# Patient Record
Sex: Female | Born: 1985 | Race: White | Hispanic: Yes | Marital: Single
Health system: Southern US, Community
[De-identification: ages and names within clinical notes are randomized; demographics above are authoritative.]

## PROBLEM LIST (undated history)

## (undated) DIAGNOSIS — J9611 Chronic respiratory failure with hypoxia: Secondary | ICD-10-CM

## (undated) DIAGNOSIS — S069X9A Unspecified intracranial injury with loss of consciousness of unspecified duration, initial encounter: Secondary | ICD-10-CM

## (undated) DIAGNOSIS — G931 Anoxic brain damage, not elsewhere classified: Secondary | ICD-10-CM

## (undated) DIAGNOSIS — L8990 Pressure ulcer of unspecified site, unspecified stage: Secondary | ICD-10-CM

## (undated) DIAGNOSIS — S069XAA Unspecified intracranial injury with loss of consciousness status unknown, initial encounter: Secondary | ICD-10-CM

## (undated) DIAGNOSIS — I82409 Acute embolism and thrombosis of unspecified deep veins of unspecified lower extremity: Secondary | ICD-10-CM

## (undated) DIAGNOSIS — Z930 Tracheostomy status: Secondary | ICD-10-CM

## (undated) DIAGNOSIS — R403 Persistent vegetative state: Secondary | ICD-10-CM

---

## 2018-12-14 ENCOUNTER — Inpatient Hospital Stay (HOSPITAL_COMMUNITY): Payer: Medicare Other

## 2018-12-14 ENCOUNTER — Encounter (HOSPITAL_COMMUNITY): Payer: Self-pay | Admitting: Pulmonary Disease

## 2018-12-14 ENCOUNTER — Inpatient Hospital Stay (HOSPITAL_COMMUNITY)
Admission: AD | Admit: 2018-12-14 | Discharge: 2018-12-20 | DRG: 870 | Disposition: A | Discharge status: Skilled Nursing Facility | Payer: Medicare Other | Source: Other Acute Inpatient Hospital | Attending: Family Medicine | Admitting: Family Medicine

## 2018-12-14 DIAGNOSIS — R403 Persistent vegetative state: Secondary | ICD-10-CM | POA: Diagnosis present

## 2018-12-14 DIAGNOSIS — I469 Cardiac arrest, cause unspecified: Secondary | ICD-10-CM

## 2018-12-14 DIAGNOSIS — K5090 Crohn's disease, unspecified, without complications: Secondary | ICD-10-CM | POA: Diagnosis present

## 2018-12-14 DIAGNOSIS — R1310 Dysphagia, unspecified: Secondary | ICD-10-CM

## 2018-12-14 DIAGNOSIS — Y838 Other surgical procedures as the cause of abnormal reaction of the patient, or of later complication, without mention of misadventure at the time of the procedure: Secondary | ICD-10-CM | POA: Diagnosis present

## 2018-12-14 DIAGNOSIS — B9620 Unspecified Escherichia coli [E. coli] as the cause of diseases classified elsewhere: Secondary | ICD-10-CM | POA: Diagnosis present

## 2018-12-14 DIAGNOSIS — D62 Acute posthemorrhagic anemia: Secondary | ICD-10-CM | POA: Diagnosis present

## 2018-12-14 DIAGNOSIS — I471 Supraventricular tachycardia: Secondary | ICD-10-CM | POA: Diagnosis present

## 2018-12-14 DIAGNOSIS — Z7401 Bed confinement status: Secondary | ICD-10-CM

## 2018-12-14 DIAGNOSIS — A419 Sepsis, unspecified organism: Principal | ICD-10-CM | POA: Diagnosis present

## 2018-12-14 DIAGNOSIS — D509 Iron deficiency anemia, unspecified: Secondary | ICD-10-CM | POA: Diagnosis present

## 2018-12-14 DIAGNOSIS — E876 Hypokalemia: Secondary | ICD-10-CM | POA: Diagnosis present

## 2018-12-14 DIAGNOSIS — N390 Urinary tract infection, site not specified: Secondary | ICD-10-CM | POA: Diagnosis present

## 2018-12-14 DIAGNOSIS — L89626 Pressure-induced deep tissue damage of left heel: Secondary | ICD-10-CM | POA: Diagnosis present

## 2018-12-14 DIAGNOSIS — K9423 Gastrostomy malfunction: Secondary | ICD-10-CM | POA: Diagnosis present

## 2018-12-14 DIAGNOSIS — L89154 Pressure ulcer of sacral region, stage 4: Secondary | ICD-10-CM

## 2018-12-14 DIAGNOSIS — J189 Pneumonia, unspecified organism: Secondary | ICD-10-CM

## 2018-12-14 DIAGNOSIS — J9611 Chronic respiratory failure with hypoxia: Secondary | ICD-10-CM | POA: Diagnosis present

## 2018-12-14 DIAGNOSIS — D696 Thrombocytopenia, unspecified: Secondary | ICD-10-CM | POA: Diagnosis present

## 2018-12-14 DIAGNOSIS — R6521 Severe sepsis with septic shock: Secondary | ICD-10-CM | POA: Diagnosis present

## 2018-12-14 DIAGNOSIS — Z1612 Extended spectrum beta lactamase (ESBL) resistance: Secondary | ICD-10-CM | POA: Diagnosis present

## 2018-12-14 DIAGNOSIS — Y95 Nosocomial condition: Secondary | ICD-10-CM | POA: Diagnosis present

## 2018-12-14 DIAGNOSIS — R578 Other shock: Secondary | ICD-10-CM | POA: Diagnosis present

## 2018-12-14 DIAGNOSIS — R319 Hematuria, unspecified: Secondary | ICD-10-CM | POA: Diagnosis present

## 2018-12-14 DIAGNOSIS — J9610 Chronic respiratory failure, unspecified whether with hypoxia or hypercapnia: Secondary | ICD-10-CM | POA: Diagnosis not present

## 2018-12-14 DIAGNOSIS — Z9911 Dependence on respirator [ventilator] status: Secondary | ICD-10-CM | POA: Diagnosis not present

## 2018-12-14 DIAGNOSIS — Z86718 Personal history of other venous thrombosis and embolism: Secondary | ICD-10-CM

## 2018-12-14 DIAGNOSIS — Z7982 Long term (current) use of aspirin: Secondary | ICD-10-CM

## 2018-12-14 DIAGNOSIS — J9612 Chronic respiratory failure with hypercapnia: Secondary | ICD-10-CM | POA: Diagnosis not present

## 2018-12-14 DIAGNOSIS — L89520 Pressure ulcer of left ankle, unstageable: Secondary | ICD-10-CM | POA: Diagnosis present

## 2018-12-14 DIAGNOSIS — Z930 Tracheostomy status: Secondary | ICD-10-CM

## 2018-12-14 DIAGNOSIS — M7989 Other specified soft tissue disorders: Secondary | ICD-10-CM | POA: Diagnosis not present

## 2018-12-14 DIAGNOSIS — Z87820 Personal history of traumatic brain injury: Secondary | ICD-10-CM

## 2018-12-14 DIAGNOSIS — D638 Anemia in other chronic diseases classified elsewhere: Secondary | ICD-10-CM | POA: Diagnosis present

## 2018-12-14 DIAGNOSIS — R609 Edema, unspecified: Secondary | ICD-10-CM

## 2018-12-14 DIAGNOSIS — R58 Hemorrhage, not elsewhere classified: Secondary | ICD-10-CM | POA: Diagnosis present

## 2018-12-14 DIAGNOSIS — E872 Acidosis: Secondary | ICD-10-CM | POA: Diagnosis present

## 2018-12-14 DIAGNOSIS — Z20828 Contact with and (suspected) exposure to other viral communicable diseases: Secondary | ICD-10-CM | POA: Diagnosis present

## 2018-12-14 DIAGNOSIS — K567 Ileus, unspecified: Secondary | ICD-10-CM | POA: Diagnosis not present

## 2018-12-14 DIAGNOSIS — G931 Anoxic brain damage, not elsewhere classified: Secondary | ICD-10-CM | POA: Diagnosis present

## 2018-12-14 DIAGNOSIS — G40909 Epilepsy, unspecified, not intractable, without status epilepticus: Secondary | ICD-10-CM | POA: Diagnosis present

## 2018-12-14 DIAGNOSIS — A4152 Sepsis due to Pseudomonas: Secondary | ICD-10-CM

## 2018-12-14 DIAGNOSIS — R9431 Abnormal electrocardiogram [ECG] [EKG]: Secondary | ICD-10-CM | POA: Diagnosis not present

## 2018-12-14 DIAGNOSIS — K9429 Other complications of gastrostomy: Secondary | ICD-10-CM | POA: Diagnosis present

## 2018-12-14 DIAGNOSIS — L538 Other specified erythematous conditions: Secondary | ICD-10-CM | POA: Diagnosis not present

## 2018-12-14 DIAGNOSIS — R10819 Abdominal tenderness, unspecified site: Secondary | ICD-10-CM

## 2018-12-14 DIAGNOSIS — Z8674 Personal history of sudden cardiac arrest: Secondary | ICD-10-CM

## 2018-12-14 HISTORY — DX: Chronic respiratory failure with hypoxia: J96.11

## 2018-12-14 HISTORY — DX: Tracheostomy status: Z93.0

## 2018-12-14 HISTORY — DX: Unspecified intracranial injury with loss of consciousness status unknown, initial encounter: S06.9XAA

## 2018-12-14 HISTORY — DX: Pressure ulcer of unspecified site, unspecified stage: L89.90

## 2018-12-14 HISTORY — DX: Anoxic brain damage, not elsewhere classified: G93.1

## 2018-12-14 HISTORY — DX: Persistent vegetative state: R40.3

## 2018-12-14 HISTORY — DX: Unspecified intracranial injury with loss of consciousness of unspecified duration, initial encounter: S06.9X9A

## 2018-12-14 HISTORY — DX: Acute embolism and thrombosis of unspecified deep veins of unspecified lower extremity: I82.409

## 2018-12-14 LAB — BASIC METABOLIC PANEL
Anion gap: 10 (ref 5–15)
BUN: 28 mg/dL — ABNORMAL HIGH (ref 6–20)
CO2: 24 mmol/L (ref 22–32)
Calcium: 8.1 mg/dL — ABNORMAL LOW (ref 8.9–10.3)
Chloride: 106 mmol/L (ref 98–111)
Creatinine, Ser: 0.42 mg/dL — ABNORMAL LOW (ref 0.44–1.00)
GFR calc Af Amer: >60 mL/min (ref >60)
GFR calc non Af Amer: >60 mL/min (ref >60)
Glucose, Bld: 91 mg/dL (ref 70–99)
Potassium: 4.7 mmol/L (ref 3.5–5.1)
Sodium: 140 mmol/L (ref 135–145)

## 2018-12-14 LAB — CBC
HCT: 25 % — ABNORMAL LOW (ref 36.0–46.0)
Hemoglobin: 7.6 g/dL — ABNORMAL LOW (ref 12.0–15.0)
MCH: 27 pg (ref 26.0–34.0)
MCHC: 30.4 g/dL (ref 30.0–36.0)
MCV: 88.7 fL (ref 80.0–100.0)
Platelets: 324 10*3/uL (ref 150–400)
RBC: 2.82 MIL/uL — ABNORMAL LOW (ref 3.87–5.11)
RDW: 17.5 % — ABNORMAL HIGH (ref 11.5–15.5)
WBC: 8.1 10*3/uL (ref 4.0–10.5)
nRBC: 0.2 % (ref 0.0–0.2)

## 2018-12-14 LAB — PROTIME-INR
INR: 1.5 — ABNORMAL HIGH (ref 0.8–1.2)
Prothrombin Time: 18.1 seconds — ABNORMAL HIGH (ref 11.4–15.2)

## 2018-12-14 LAB — GLUCOSE, CAPILLARY
Glucose-Capillary: 92 mg/dL (ref 70–99)
Glucose-Capillary: 101 mg/dL — ABNORMAL HIGH (ref 70–99)
Glucose-Capillary: 109 mg/dL — ABNORMAL HIGH (ref 70–99)
Glucose-Capillary: 85 mg/dL (ref 70–99)
Glucose-Capillary: 92 mg/dL (ref 70–99)
Glucose-Capillary: 101 mg/dL — ABNORMAL HIGH (ref 70–99)

## 2018-12-14 LAB — POCT I-STAT 7, (LYTES, BLD GAS, ICA,H+H)
Bicarbonate: 23.7 mmol/L (ref 20.0–28.0)
Calcium, Ion: 1.19 mmol/L (ref 1.15–1.40)
HCT: 25 % — ABNORMAL LOW (ref 36.0–46.0)
Hemoglobin: 8.5 g/dL — ABNORMAL LOW (ref 12.0–15.0)
O2 Saturation: 99 %
Patient temperature: 98.3
Potassium: 3.3 mmol/L — ABNORMAL LOW (ref 3.5–5.1)
Sodium: 140 mmol/L (ref 135–145)
TCO2: 25 mmol/L (ref 22–32)
pCO2 arterial: 35.1 mmHg (ref 32.0–48.0)
pH, Arterial: 7.437 (ref 7.350–7.450)
pO2, Arterial: 144 mmHg — ABNORMAL HIGH (ref 83.0–108.0)

## 2018-12-14 LAB — URINALYSIS, ROUTINE W REFLEX MICROSCOPIC
Bilirubin Urine: NEGATIVE
Glucose, UA: NEGATIVE mg/dL
Hgb urine dipstick: NEGATIVE
Ketones, ur: NEGATIVE mg/dL
Nitrite: NEGATIVE
Protein, ur: NEGATIVE mg/dL
Specific Gravity, Urine: 1.014 (ref 1.005–1.030)
pH: 5 (ref 5.0–8.0)

## 2018-12-14 LAB — SAVE SMEAR(SSMR), FOR PROVIDER SLIDE REVIEW

## 2018-12-14 LAB — HEMOGLOBIN AND HEMATOCRIT, BLOOD
HCT: 22.3 % — ABNORMAL LOW (ref 36.0–46.0)
Hemoglobin: 6.9 g/dL — CL (ref 12.0–15.0)

## 2018-12-14 LAB — PREGNANCY, URINE: Preg Test, Ur: NEGATIVE

## 2018-12-14 LAB — RETICULOCYTES
Immature Retic Fract: 39.6 % — ABNORMAL HIGH (ref 2.3–15.9)
RBC.: 2.82 MIL/uL — ABNORMAL LOW (ref 3.87–5.11)
Retic Count, Absolute: 139.3 10*3/uL (ref 19.0–186.0)
Retic Ct Pct: 4.9 % — ABNORMAL HIGH (ref 0.4–3.1)

## 2018-12-14 LAB — HEMOGLOBIN A1C
Hgb A1c MFr Bld: 4.9 % (ref 4.8–5.6)
Mean Plasma Glucose: 93.93 mg/dL

## 2018-12-14 LAB — LACTIC ACID, PLASMA
Lactic Acid, Venous: 3 mmol/L (ref 0.5–1.9)
Lactic Acid, Venous: 1.9 mmol/L (ref 0.5–1.9)

## 2018-12-14 LAB — IRON AND TIBC
Iron: 25 ug/dL — ABNORMAL LOW (ref 28–170)
Saturation Ratios: 17 % (ref 10.4–31.8)
TIBC: 150 ug/dL — ABNORMAL LOW (ref 250–450)
UIBC: 125 ug/dL

## 2018-12-14 LAB — PREPARE RBC (CROSSMATCH)

## 2018-12-14 LAB — ABO/RH: ABO/RH(D): O POS

## 2018-12-14 LAB — HIV ANTIBODY (ROUTINE TESTING W REFLEX): HIV Screen 4th Generation wRfx: NONREACTIVE

## 2018-12-14 LAB — MAGNESIUM: Magnesium: 2.1 mg/dL (ref 1.7–2.4)

## 2018-12-14 LAB — PROCALCITONIN: Procalcitonin: 7.07 ng/mL

## 2018-12-14 LAB — APTT: aPTT: 28 seconds (ref 24–36)

## 2018-12-14 LAB — MRSA PCR SCREENING: MRSA by PCR: NEGATIVE

## 2018-12-14 MED ORDER — SODIUM CHLORIDE 0.9 % IV BOLUS
1000.0000 mL | Freq: Once | INTRAVENOUS | Status: AC
  Administered 2018-12-14: 1000 mL via INTRAVENOUS

## 2018-12-14 MED ORDER — VASOPRESSIN 20 UNIT/ML IV SOLN
0.0300 [IU]/min | INTRAVENOUS | Status: DC
  Administered 2018-12-14 – 2018-12-16 (×3): 0.03 [IU]/min via INTRAVENOUS
  Filled 2018-12-14 (×5): qty 2

## 2018-12-14 MED ORDER — ORAL CARE MOUTH RINSE
15.0000 mL | OROMUCOSAL | Status: DC
  Administered 2018-12-14 – 2018-12-20 (×59): 15 mL via OROMUCOSAL

## 2018-12-14 MED ORDER — NOREPINEPHRINE 4 MG/250ML-% IV SOLN
0.0000 ug/min | INTRAVENOUS | Status: DC
  Administered 2018-12-14: 12 ug/min via INTRAVENOUS

## 2018-12-14 MED ORDER — SODIUM CHLORIDE 0.9 % IV SOLN
2.0000 g | Freq: Three times a day (TID) | INTRAVENOUS | Status: DC
  Administered 2018-12-14 – 2018-12-16 (×5): 2 g via INTRAVENOUS
  Filled 2018-12-14 (×5): qty 2

## 2018-12-14 MED ORDER — SODIUM CHLORIDE 0.9% IV SOLUTION
Freq: Once | INTRAVENOUS | Status: AC
  Administered 2018-12-14: 21:00:00 via INTRAVENOUS

## 2018-12-14 MED ORDER — FENTANYL CITRATE (PF) 100 MCG/2ML IJ SOLN
50.0000 ug | INTRAMUSCULAR | Status: DC | PRN
  Administered 2018-12-17: 100 ug via INTRAVENOUS
  Administered 2018-12-17: 50 ug via INTRAVENOUS
  Administered 2018-12-17: 75 ug via INTRAVENOUS
  Administered 2018-12-18: 100 ug via INTRAVENOUS
  Filled 2018-12-14 (×5): qty 2

## 2018-12-14 MED ORDER — FOLIC ACID 5 MG/ML IJ SOLN
1.0000 mg | Freq: Every day | INTRAMUSCULAR | Status: DC
  Administered 2018-12-14 – 2018-12-19 (×6): 1 mg via INTRAVENOUS
  Filled 2018-12-14 (×7): qty 0.2

## 2018-12-14 MED ORDER — VANCOMYCIN HCL 10 G IV SOLR
1500.0000 mg | Freq: Once | INTRAVENOUS | Status: AC
  Administered 2018-12-14: 1500 mg via INTRAVENOUS
  Filled 2018-12-14: qty 1500

## 2018-12-14 MED ORDER — VANCOMYCIN HCL IN DEXTROSE 1-5 GM/200ML-% IV SOLN
1000.0000 mg | Freq: Two times a day (BID) | INTRAVENOUS | Status: DC
  Administered 2018-12-14 – 2018-12-16 (×4): 1000 mg via INTRAVENOUS
  Filled 2018-12-14 (×4): qty 200

## 2018-12-14 MED ORDER — PANTOPRAZOLE SODIUM 40 MG IV SOLR
40.0000 mg | INTRAVENOUS | Status: DC
  Administered 2018-12-14 – 2018-12-18 (×5): 40 mg via INTRAVENOUS
  Filled 2018-12-14 (×7): qty 40

## 2018-12-14 MED ORDER — CHLORHEXIDINE GLUCONATE CLOTH 2 % EX PADS
6.0000 | MEDICATED_PAD | Freq: Every day | CUTANEOUS | Status: DC
  Administered 2018-12-14 – 2018-12-19 (×6): 6 via TOPICAL

## 2018-12-14 MED ORDER — CHLORHEXIDINE GLUCONATE 0.12% ORAL RINSE (MEDLINE KIT)
15.0000 mL | Freq: Two times a day (BID) | OROMUCOSAL | Status: DC
  Administered 2018-12-14 – 2018-12-19 (×12): 15 mL via OROMUCOSAL

## 2018-12-14 MED ORDER — PHENYLEPHRINE HCL-NACL 10-0.9 MG/250ML-% IV SOLN
0.0000 ug/min | INTRAVENOUS | Status: DC
  Administered 2018-12-14: 70 ug/min via INTRAVENOUS
  Administered 2018-12-14: 20 ug/min via INTRAVENOUS
  Administered 2018-12-14 (×2): 50 ug/min via INTRAVENOUS
  Administered 2018-12-14: 40 ug/min via INTRAVENOUS
  Administered 2018-12-14: 70 ug/min via INTRAVENOUS
  Administered 2018-12-14 – 2018-12-15 (×2): 50 ug/min via INTRAVENOUS
  Administered 2018-12-15: 30 ug/min via INTRAVENOUS
  Administered 2018-12-15: 40 ug/min via INTRAVENOUS
  Filled 2018-12-14 (×6): qty 250
  Filled 2018-12-14: qty 500
  Filled 2018-12-14 (×2): qty 250

## 2018-12-14 MED ORDER — BISACODYL 10 MG RE SUPP: 10.0000 mg | Freq: Every day | RECTAL | Status: DC | PRN

## 2018-12-14 MED ORDER — FENTANYL CITRATE (PF) 100 MCG/2ML IJ SOLN
50.0000 ug | INTRAMUSCULAR | Status: DC | PRN
  Administered 2018-12-16: 50 ug via INTRAVENOUS
  Filled 2018-12-14: qty 2

## 2018-12-14 NOTE — Progress Notes (Signed)
Initial Nutrition Assessment  DOCUMENTATION CODES:    Not applicable  INTERVENTION:   Since MAP is consistently above 60, recommend at least trickle TF via PEG:   Vital AF 1.2 at 20 ml/h, as able increase to goal rate of 65 ml/h (1560 ml per day)   Provides 1872 kcal, 117 gm protein, 1265 ml free water daily   Would also benefit from addition of Juven BID via PEG, each packet provides 80 calories, 8 grams of carbohydrate, 2.5  grams of protein (collagen), 7 grams of L-arginine and 7 grams of L-glutamine; supplement contains CaHMB, Vitamins C, E, B12 and Zinc to promote wound healing  NUTRITION DIAGNOSIS:   Increased nutrient needs related to chronic illness, wound healing as evidenced by estimated needs.  GOAL:   Patient will meet greater than or equal to 90% of their needs  MONITOR:   Labs, Skin, I & O's  REASON FOR ASSESSMENT:   Ventilator    ASSESSMENT:   33 yo female admitted from [facility] in [place] with hemorrhagic shock from recently debrided sacral wound, UTI, PNA, coded at OSH, then transferred to [facility] for further ICU care. PMH includes anoxic brain injury earlier this year from cardiac arrest, persistent vegetative state with trach, vent dependence, large sacral pressure injury, seizure D/O, asthma.   Discussed patient in ICU rounds and with RN today. Per RN, patient has a PEG. Patient was on 4 pressors yesterday, now on 2 pressors. RN reports MD does not want to start TF due to increased pressor requirement.   Patient is currently intubated on ventilator support MV: 8.4 L/min Temp (24hrs), Avg:98.6 F (37 C), Min:98.1 F (36.7 C), Max:99.3 F (37.4 C) MAP >/= 67 so far today.  Labs reviewed.  CBG's: 109-85  Medications reviewed and include folic acid, neosynephrine, vasopressin.    NUTRITION - FOCUSED PHYSICAL EXAM:  deferred, patient on contact precautions  Diet Order:   Diet Order            Diet NPO time specified  Diet effective now               EDUCATION NEEDS:   No education needs have been identified at this time  Skin:  Skin Assessment: Skin Integrity Issues: Skin Integrity Issues:: Stage IV, Other (Comment) Stage IV: sacrum Other: 3 full thickness wounds to upper back  Last BM:  10/21 type 7  Height:   Ht Readings from Last 1 Encounters:  12/14/18 5\' 6"  (1.676 m)    Weight:   Wt Readings from Last 1 Encounters:  12/14/18 65 kg    Ideal Body Weight:  59.1 kg  BMI:  Body mass index is 23.13 kg/m.  Estimated Nutritional Needs:   Kcal:  1800-1950  Protein:  100-130 gm  Fluid:  >/= 1.8 L    Molli Barrows, RD, LDN, [place] Pager [phone] After Hours Pager [phone]

## 2018-12-14 NOTE — Progress Notes (Signed)
Pt traveled to CT via ventilator with no complications noted. Mickeal Needy RRT RCP notified of patients return to 2M08.

## 2018-12-14 NOTE — Progress Notes (Addendum)
Pharmacy Antibiotic Note  Ashlee Wagner is a 33 y.o. female admitted on 12/14/2018 with sepsis. Patient is trach and ventilator dependent with a large sacral decubitus ulcer who presented from an OSH in [place]. Pharmacy has been consulted for vancomycin and cefepime dosing for UTI/pneumonia. Levofloxacin 750mg  was administered at [facility] on 10/20 at the outside facility. Patient is afebrile, WBC 8.1, procalcitonin 7.07, and lactic acid 3.0 now decreased to 1.9  Vancomycin 1000 mg IV Q 12 hrs. Goal AUC 400-550. Expected AUC: 525 SCr used: 0.8  Of note, patient has an allergy to amoxicillin. Discussed with patient's mother, patient had hives and difficulty breathing to amoxicillin when 58-55 years of age and has tolerated cephalosporins since. Likely patient has outgrown this allergy but discussed with nursing and will monitor closely while administering cefepime.    Plan: Vancomycin 1500mg  loading dose x1  Start vancomycin 1000mg  IV q12h (next dose at 2100 on 10/21) Start cefepime 2g IV q8h (at 2000 on 10/21 - 24 hours after levofloxacin dose) Monitor renal function, cultures/sensitivities, signs of allergic response, and clinical progression  Height: 5\' 6"  (167.6 cm) Weight: 143 lb 4.8 oz (65 kg) IBW/kg (Calculated) : 59.3  Temp (24hrs), Avg:98.6 F (37 C), Min:98.1 F (36.7 C), Max:99.3 F (37.4 C)  Recent Labs  Lab 12/14/18 0753 12/14/18 1111 12/14/18 1200  WBC  --  8.1  --   CREATININE  --  0.42*  --   LATICACIDVEN 3.0*  --  1.9    Estimated Creatinine Clearance: 92.8 mL/min (A) (by C-G formula based on SCr of 0.42 mg/dL (L)).    Allergies  Allergen Reactions  . Acetaminophen   . Amoxicillin   . Codeine   . Fluzone  [Flu Virus Vaccine]     unspecified  . Gabapentin   . Iron Sucrose     Other reaction(s): Anaphylactoid  . Pregabalin   . Warfarin     Antimicrobials this admission: Levofloxacin x1 10/20 (at OSH) Vancomycin 10/21 >> Cefepime 10/21 >>   Dose  adjustments this admission: N/A  Microbiology results: 10/21 BCx: collected  10/21 UCx: ordered 10/21 trach aspirate: rare gram negative rods  10/21 MRSA PCR: negative  Thank you for allowing pharmacy to be a part of this patient's care.  Cristela Felt, PharmD PGY1 Pharmacy Resident [employer]: [phone]  12/14/2018 3:06 PM

## 2018-12-14 NOTE — Progress Notes (Signed)
NAME:  Ashlee Wagner, MRN:  [id number], DOB:  01/07/1986, LOS: 0 ADMISSION DATE:  12/14/2018, CONSULTATION DATE:  12/14/18 CHIEF COMPLAINT:  Sepsis  Brief History   [name] with PMH or TBI requiring trach and ventilator dependence who has a large sacral decubitus ulcer, per paper records and report, this wound was apparently debrided at her nursing home and staff found her in with a large volume of blood in the bed which they thought came from the wound. She was brought to [facility] in [place] where there was no further evidence of bleeding, however pt had evidence of UTI, PNA, lactic acidosis and became bradycardic and coded requiring atropine, CPR and cardioversion.  There were no regional ICU beds available, so the patient was transferred to [facility]  History of present illness   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  All history is taken from paper chart and care everywhere in Epic as patient is unresponsive.   On arrival to the ED, no wound bleeding was appreciated, but foley bulb noted to be out of the urethra with hematuria.   ED work-up notable for lactic acidosis of 6.1, leukocytosis of 17.5k, UTI and Hgb of 6.7.   Pt was given 3L IVF, levaquin and 2 units PRBC's and apparently became bradycardic and was given atropine.  When BP dropped, CPR was initiated.  Per notes she had three minutes of chest compression, epix1 and defibrillated and then pt converted back to sinus tachycardia.  CXR showed LUL and RML infiltrates, but noted to have improved compared to study done 11/29/18.  She was started on  Levophed and transferred to [facility].     On review of care everywhere pt has been admitted to [facility] earlier this year and found to have retroperitoneal hematoma while anti-coagulated for DVT.   On arrival, pt is tachycardic to 140's, hypotensive and minimally responsive.  Past Medical History   Past Medical History:  Diagnosis Date   Anoxic brain injury (HCC)    Chronic respiratory failure with hypoxia (HCC)    Trach/vent dependent    DVT (deep venous thrombosis) (HCC)    recurrent: off anticoagulation following RP bleed 08/2018.   Persistent vegetative state (HCC)    Pressure ulcer    decub, back.    Tracheostomy status (HCC)    Traumatic brain injury (HCC)      Significant Hospital Events   10/21 Admit to PCCM  Consults:    Procedures:    Significant Diagnostic Tests:  CT abd/pelvis>> CT head>>  Micro Data:  10/21 BC x2>> 10/21 UC>>  Antimicrobials:  Levaquin 10/20 only Vanc 10/21> Cefepime 10/21>  Interim history/subjective:  10/21: remains minimally responsive. Now on 3 pressors.   Objective   Blood pressure (!) 100/54, pulse (!) 129, temperature 98.1 F (36.7 C), temperature source Oral, resp. rate 20, weight 65 kg, SpO2 100 %.    Vent Mode: PRVC FiO2 (%):  [50 %] 50 % Set Rate:  [20 bmp] 20 bmp Vt Set:  [450 mL] 450 mL PEEP:  [5 cmH20] 5 cmH20 Plateau Pressure:  [15 cmH20] 15 cmH20   Intake/Output Summary (  Last 24 hours) at 12/14/2018 4854 Last data filed at 12/14/2018 0800 Gross per 24 hour  Intake 176.28 ml  Output 250 ml  Net -73.72 ml   Filed Weights   12/14/18 0600  Weight: 65 kg     General:  Chronically ill F, trach in place on ventilator, no apparent distress HEENT: MM pink/moist, tracheostomy in place, pupills 25mm equal bilaterally and responsive to light Neuro: minimally  responsive to pain and voice CV: s1s2 tachycardic, regular rate and rhythm, no m/r/g PULM:  Decreased air movement bilateral bases, no  wheezing or rhonchi GI: soft, bsx4 active  Extremities: cool/dry, no edema  Skin: large sacral decubitus ulcer and L upper back pressure wound as noted below          Resolved Hospital Problem list     Assessment & Plan:   Shock hemorrhagic vs septic -question of bleeding from wounds vs. Sepsis from UTI/ PNA -given 2 units PRBC's at OSH P: -check repeat labs including lactic acid, CBC, CMP  -ABG reassuring -Follow blood and urine cultures and start Vanc/Cefepime -f/u Hgb post-transfusion  -titrate pressors to map >65 -attempting to change to  vasopressin and neosynephrine 2/2 tachycardia with levo  L pna, gp/gn:  -noted on cxr -cont tx with vanc/cefepime -resp cx pending  Acute on chronic anemia -baseline Hgb in care everywhere appears to be ~8-10 -admitted in July with Hgb drop and found to have retroperitoneal hematoma P: -Check CT abd/pelvis, coags -repeat Hgb pending post-transfusion  Large sacral and back decubitus ulcers -possibly the source of pt's ABLA -do not appear cellulitic P: -consult wound care  Anoxic brain injury with chronic respiratory failure and vent dependence -unclear what her neurologic baseline is, notes from OSH note spontaneously opening eyes and anxious -largely unresponsive on arrival P: -Check CT head -Continue full vent support -ABG reassuring  Prolonged Qtc -Qtc 551 P: -check mag level and monitor  Lactic acidosis:  -f/u around 1200 today.   Hypokalemia:  -awaiting labs for Cr      Best practice:  Diet: NPO Pain/Anxiety/Delirium protocol (if indicated): Fentanyl VAP protocol (if indicated): yes DVT prophylaxis: SCD's GI prophylaxis: protonix Glucose control: SSI Mobility: bed-bound Code Status: cull Family Communication: phone conversation with mother Disposition: ICU  Labs   CBC: Recent Labs  Lab 12/14/18 0625  HGB 8.5*  HCT 25.0*    Basic Metabolic Panel: Recent Labs  Lab 12/14/18 0625  NA  140  K 3.3*   GFR: CrCl cannot be calculated (No successful lab value found.). No results for input(s): PROCALCITON, WBC, LATICACIDVEN in the last 168 hours.  Liver Function Tests: No results for input(s): AST, ALT, ALKPHOS, BILITOT, PROT, ALBUMIN in the last 168 hours. No results for input(s): LIPASE, AMYLASE in the last 168 hours. No results for input(s): AMMONIA in the last 168 hours.  ABG    Component Value Date/Time   PHART 7.437 12/14/2018 0625   PCO2ART 35.1 12/14/2018 0625   PO2ART 144.0 (H) 12/14/2018 0625   HCO3 23.7 12/14/2018 0625   TCO2 25 12/14/2018 0625   O2SAT 99.0 12/14/2018 0625     Coagulation Profile: No results for input(s): INR, PROTIME in the last 168 hours.  Cardiac Enzymes: No results for input(s): CKTOTAL, CKMB, CKMBINDEX, TROPONINI in the last 168 hours.  HbA1C: No results found for: HGBA1C  CBG: Recent Labs  Lab 12/14/18 0505 12/14/18 0733  GLUCAP 92 101*    Critical care time: 31 minutes     CRITICAL CARE  Performed by: Briant Sites   Total critical care time: 31 minutes  Critical care time was exclusive of separately billable procedures and treating other patients.  Critical care was necessary to treat or prevent imminent or life-threatening deterioration.  Critical care was time spent personally by me on the following activities: development of treatment plan with patient and/or surrogate as well as nursing, discussions with consultants, evaluation of patient's response to treatment, examination of patient, obtaining history from patient or surrogate, ordering and performing treatments and interventions, ordering and review of laboratory studies, ordering and review of radiographic studies, pulse oximetry and re-evaluation of patient's condition.   Briant Sites DO After hours pager: [phone]  [facility] 12/14/2018, 8:14 AM

## 2018-12-14 NOTE — Progress Notes (Signed)
[facility] Progress Note Patient Name: Ashlee Wagner DOB: [birth year] MRN: [id number]   Date of Service  12/14/2018  HPI/Events of Note  Hemoglobin 6.9  eICU Interventions  Transfuse 1 unit of PRBC,  CBC in a.m.        Frederik Pear 12/14/2018, 8:47 PM

## 2018-12-14 NOTE — Consult Note (Signed)
[facility] Nurse wound consult note Reason for Consult: Consult requested for multiple wounds.  Pt is critically ill with multiple systemic factors which can impair healing.  Wound type: Sacrum with chronic stage 4 wound; 7X11X4cm, 1 cm undermining to wound edges. beefy red, mod amt pink drainage, no odor Left heel with 2X2 cm dark-colored skin; deep tissue pressure injury Left outer ankle with unstageable pressure injury, .5X.5cm dry eschar, no odor or drainage Left posterior back with 3 areas of chronic full thickness wounds; all are red and moist with mod amt pink drainage, no odor Upper back: 4X4X1cm Middle: 4X3X.3cm Lower: 1.5X1.5X.3cm Pressure Injury POA: Yes Dressing procedure/placement/frequency: Pt is on a low airloss mattress to reduce pressure.  Foam dressings to protect left outer ankle and heel Moist gauze dressings to promote healing to sacrum and back wounds.  Please re-consult if further assistance is needed.  Thank-you,  Julien Girt MSN, [place], [place], [place], [place]

## 2018-12-14 NOTE — Progress Notes (Signed)
Chronic trach/vent dependent patient received from outside facility, placed on usual settings.  PRVC 450, rate 20, 50%, PEEP 5.

## 2018-12-14 NOTE — H&P (Addendum)
NAME:  Ashlee Wagner, MRN:  [id number], DOB:  [birth year], LOS: 0 ADMISSION DATE:  12/14/2018, CONSULTATION DATE:  12/14/18 CHIEF COMPLAINT:  Sepsis  Brief History   [name] with PMH or TBI requiring trach and ventilator dependence who has a large sacral decubitus ulcer, per paper records and report, this wound was apparently debrided at her nursing home and staff found her in with a large volume of blood in the bed which they thought came from the wound. She was brought to [facility] in [place] where there was no further evidence of bleeding, however pt had evidence of UTI, PNA, lactic acidosis and became bradycardic and coded requiring atropine, CPR and cardioversion.  There were no regional ICU beds available, so the patient was transferred to [facility]  History of present illness   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  All history is taken from paper chart and care everywhere in Epic as patient is unresponsive.   On arrival to the ED, no wound bleeding was appreciated, but foley bulb noted to be out of the urethra with hematuria.   ED work-up notable for lactic acidosis of 6.1, leukocytosis of 17.5k, UTI and Hgb of 6.7.   Pt was given 3L IVF, levaquin and 2 units PRBC's and apparently became bradycardic and was given atropine.  When BP dropped, CPR was initiated.  Per notes she had three minutes of chest compression, epix1 and defibrillated and then pt converted back to sinus tachycardia.  CXR showed LUL and RML infiltrates, but noted to have improved compared to study done 11/29/18.  She was started on  Levophed and transferred to [facility].     On review of care everywhere pt has been admitted to [facility] earlier this year and found to have retroperitoneal hematoma while anti-coagulated for DVT.   On arrival, pt is tachycardic to 140's, hypotensive and minimally responsive.  Past Medical History   Past Medical History:  Diagnosis Date  . Anoxic brain injury (HCC)   . Chronic respiratory failure with hypoxia (HCC)    Trach/vent dependent   . DVT (deep venous thrombosis) (HCC)    recurrent: off anticoagulation following RP bleed 08/2018.  Marland Kitchen Persistent vegetative state (HCC)   . Pressure ulcer    decub, back.   . Tracheostomy status (HCC)   . Traumatic brain injury (HCC)      Significant Hospital Events   10/21 Admit to PCCM  Consults:    Procedures:    Significant Diagnostic Tests:  CT abd/pelvis>> CT head>>  Micro Data:  10/21 BC x2>> 10/21 UC>>  Antimicrobials:  Levaquin 10/20 only Vanc 10/21> Cefepime 10/21>  Interim history/subjective:  Arrived to [facility] on Levophed , SBP 85 and HR 140's, eyes spontaneously opening but otherwise unresponsive  Objective   Pulse (!) 143, temperature 98.3 F (36.8 C), temperature source Axillary, resp. rate (!) 21.    Vent Mode: PRVC FiO2 (%):  [50 %] 50 % Set Rate:  [20 bmp] 20 bmp Vt Set:  [450 mL] 450 mL PEEP:  [5 cmH20] 5 cmH20 Plateau Pressure:  [15 cmH20] 15 cmH20  No intake or  output data in the 24 hours ending 12/14/18 0556 There were no vitals filed for this visit.   General:  Chronically ill F, trach in place on ventilator, no apparent distress HEENT: MM pink/moist, tracheostomy in place, pupills 21mm equal bilaterally and responsive to light Neuro: spontaneously opening her eyes and grimacing, not responsive to pain or voice CV: s1s2 tachycardic, regular rate and rhythm, no m/r/g PULM:  Decreased air movement bilateral bases, no wheezing or rhonchi GI: soft, bsx4 active  Extremities:  cool/dry, no edema  Skin: large sacral decubitus ulcer and L upper back pressure wound as noted below          Resolved Hospital Problem list     Assessment & Plan:   Shock hemorrhagic vs septic -question of bleeding from wounds vs. Sepsis from UTI/ PNA -given 2 units PRBC's at OSH P: -check repeat labs including lactic acid, CBC, CMP  -ABG reassuring -Follow blood and urine cultures and start Vanc/Cefepime -f/u Hgb post-transfusion  -stop levophed due to tachycardia, start vasopressin and neosynephrine  Acute on chronic anemia -baseline Hgb in care everywhere appears to be ~8-10 -admitted in July with Hgb drop and found to have retroperitoneal hematoma P: -Check CT abd/pelvis, coags -repeat Hgb pending post-transfusion  Large sacral and back decubitus ulcers -possibly the source of pt's ABLA -do not appear cellulitis P: -consult wound care  Anoxic brain injury with chronic respiratory failure and vent dependence -unclear what her neurologic baseline is, notes from OSH note spontaneously opening eyes and anxious -largely unresponsive on arrival P: -Check CT head -Continue full vent support -ABG reassuring  Prolonged Qtc -Qtc 551 P: -check mag level and monitor      Best practice:  Diet: NPO Pain/Anxiety/Delirium protocol (if indicated): Fentanyl VAP protocol (if indicated): yes DVT prophylaxis: SCD's GI prophylaxis: protonix Glucose control: SSI Mobility: bed-bound Code Status: cull Family Communication: phone conversation with mother Disposition: ICU  Labs   CBC: No results for input(s): WBC, NEUTROABS, HGB, HCT, MCV, PLT in the last 168 hours.  Basic Metabolic Panel: No results for input(s): NA, K, CL, CO2, GLUCOSE, BUN, CREATININE, CALCIUM, MG, PHOS in the last 168 hours. GFR: CrCl cannot be calculated (No successful lab value found.). No results for input(s): PROCALCITON, WBC, LATICACIDVEN in the last 168 hours.  Liver Function Tests:  No results for input(s): AST, ALT, ALKPHOS, BILITOT, PROT, ALBUMIN in the last 168 hours. No results for input(s): LIPASE, AMYLASE in the last 168 hours. No results for input(s): AMMONIA in the last 168 hours.  ABG No results found for: PHART, PCO2ART, PO2ART, HCO3, TCO2, ACIDBASEDEF, O2SAT   Coagulation Profile: No results for input(s): INR, PROTIME in the last 168 hours.  Cardiac Enzymes: No results for input(s): CKTOTAL, CKMB, CKMBINDEX, TROPONINI in the last 168 hours.  HbA1C: No results found for: HGBA1C  CBG: Recent Labs  Lab 12/14/18 0505  GLUCAP 92    Review of Systems:   Unable to obtain secondary to AMS  Past Medical History   Past Medical History:  Diagnosis Date  . Anoxic brain injury ([place])   . Chronic respiratory failure with hypoxia (HCC)    Trach/vent dependent   . DVT (deep venous thrombosis) ([place])    recurrent: off anticoagulation following RP bleed 08/2018.  Marland Kitchen Persistent vegetative state ([place])   . Pressure ulcer    decub, back.   . Tracheostomy status ([place])   . Traumatic brain injury [facility])      Surgical History  Social History    Lives in nursing facility  Family History   Her family history is not on file.   Allergies Not on File   Home Medications  Prior to Admission medications   Not on File     Critical care time: 55 minutes     CRITICAL CARE Performed by: Darcella Gasman Jeaneen Cala   Total critical care time: 55 minutes  Critical care time was exclusive of separately billable procedures and treating other patients.  Critical care was necessary to treat or prevent imminent or life-threatening deterioration.  Critical care was time spent personally by me on the following activities: development of treatment plan with patient and/or surrogate as well as nursing, discussions with consultants, evaluation of patient's response to treatment, examination of patient, obtaining history from patient or surrogate, ordering and performing  treatments and interventions, ordering and review of laboratory studies, ordering and review of radiographic studies, pulse oximetry and re-evaluation of patient's condition.  Darcella Gasman Verline Kong, PA-C [facility]  Pager# [phone], if no answer [phone]

## 2018-12-14 NOTE — Progress Notes (Signed)
eLink Physician-Brief Progress Note Patient Name: Ashlee Wagner DOB: 1985-04-30 MRN: [id number]   Date of Service  12/14/2018  HPI/Events of Note  Pt transferred from OSH ED with decubitus wound bleeding, hemorrhagic +/- septic shock due to co-existing pneumonia/UTI. She briefly arrested at OSH and required resuscitation.  eICU Interventions  New patient evaluation completed. PCCM on the ground admitted her.        [name] Ogan 12/14/2018, 6:07 AM

## 2018-12-14 NOTE — Progress Notes (Signed)
Pharmacy Note  Pharmacy requested to review medications for QTc prolongation.  Pt is regularly on famotidone, furosemide, and leveciracetam, all of which can cause QTc prolongation.  Of note, pt was started on Levaquin (start date unclear, trimmed off of MAR that was sent with pt).  The addition of Levaquin could have caused QTc prolongation when pt was already on QTc-prolonging medications.  Wynona Neat, PharmD, BCPS 12/14/2018 7:32 AM

## 2018-12-15 ENCOUNTER — Inpatient Hospital Stay (HOSPITAL_COMMUNITY): Payer: Medicare Other

## 2018-12-15 ENCOUNTER — Encounter (HOSPITAL_COMMUNITY): Payer: Self-pay | Admitting: Radiology

## 2018-12-15 DIAGNOSIS — R9431 Abnormal electrocardiogram [ECG] [EKG]: Secondary | ICD-10-CM | POA: Diagnosis not present

## 2018-12-15 HISTORY — PX: IR REPLACE G-TUBE SIMPLE WO FLUORO: IMG2323

## 2018-12-15 LAB — PHOSPHORUS: Phosphorus: 2 mg/dL — ABNORMAL LOW (ref 2.5–4.6)

## 2018-12-15 LAB — GLUCOSE, CAPILLARY
Glucose-Capillary: 89 mg/dL (ref 70–99)
Glucose-Capillary: 93 mg/dL (ref 70–99)
Glucose-Capillary: 87 mg/dL (ref 70–99)
Glucose-Capillary: 84 mg/dL (ref 70–99)
Glucose-Capillary: 96 mg/dL (ref 70–99)

## 2018-12-15 LAB — BASIC METABOLIC PANEL
Anion gap: 7 (ref 5–15)
Anion gap: 9 (ref 5–15)
BUN: 17 mg/dL (ref 6–20)
BUN: 15 mg/dL (ref 6–20)
CO2: 22 mmol/L (ref 22–32)
CO2: 20 mmol/L — ABNORMAL LOW (ref 22–32)
Calcium: 8.3 mg/dL — ABNORMAL LOW (ref 8.9–10.3)
Calcium: 8.5 mg/dL — ABNORMAL LOW (ref 8.9–10.3)
Chloride: 115 mmol/L — ABNORMAL HIGH (ref 98–111)
Chloride: 112 mmol/L — ABNORMAL HIGH (ref 98–111)
Creatinine, Ser: <0.3 mg/dL — ABNORMAL LOW (ref 0.44–1.00)
Creatinine, Ser: <0.3 mg/dL — ABNORMAL LOW (ref 0.44–1.00)
Glucose, Bld: 93 mg/dL (ref 70–99)
Glucose, Bld: 99 mg/dL (ref 70–99)
Potassium: <2 mmol/L — CL (ref 3.5–5.1)
Potassium: 2.2 mmol/L — CL (ref 3.5–5.1)
Sodium: 144 mmol/L (ref 135–145)
Sodium: 141 mmol/L (ref 135–145)

## 2018-12-15 LAB — HEMOGLOBIN AND HEMATOCRIT, BLOOD
HCT: 23.1 % — ABNORMAL LOW (ref 36.0–46.0)
HCT: 22.6 % — ABNORMAL LOW (ref 36.0–46.0)
Hemoglobin: 7.4 g/dL — ABNORMAL LOW (ref 12.0–15.0)
Hemoglobin: 7 g/dL — ABNORMAL LOW (ref 12.0–15.0)

## 2018-12-15 LAB — CBC
HCT: 25.8 % — ABNORMAL LOW (ref 36.0–46.0)
Hemoglobin: 8.1 g/dL — ABNORMAL LOW (ref 12.0–15.0)
MCH: 27.6 pg (ref 26.0–34.0)
MCHC: 31.4 g/dL (ref 30.0–36.0)
MCV: 88.1 fL (ref 80.0–100.0)
Platelets: 428 10*3/uL — ABNORMAL HIGH (ref 150–400)
RBC: 2.93 MIL/uL — ABNORMAL LOW (ref 3.87–5.11)
RDW: 16.9 % — ABNORMAL HIGH (ref 11.5–15.5)
WBC: 6.6 10*3/uL (ref 4.0–10.5)
nRBC: 0 % (ref 0.0–0.2)

## 2018-12-15 LAB — MAGNESIUM
Magnesium: 1.8 mg/dL (ref 1.7–2.4)
Magnesium: 2 mg/dL (ref 1.7–2.4)

## 2018-12-15 LAB — PREPARE RBC (CROSSMATCH)

## 2018-12-15 MED ORDER — MIDODRINE HCL 5 MG PO TABS
10.0000 mg | ORAL_TABLET | Freq: Three times a day (TID) | ORAL | Status: DC
  Administered 2018-12-15: 10 mg via ORAL
  Filled 2018-12-15: qty 2

## 2018-12-15 MED ORDER — POTASSIUM CHLORIDE 10 MEQ/50ML IV SOLN
10.0000 meq | INTRAVENOUS | Status: DC
  Administered 2018-12-15: 10 meq via INTRAVENOUS
  Filled 2018-12-15: qty 50

## 2018-12-15 MED ORDER — POTASSIUM CHLORIDE 10 MEQ/100ML IV SOLN
10.0000 meq | INTRAVENOUS | Status: DC
  Administered 2018-12-15: 10 meq via INTRAVENOUS
  Filled 2018-12-15: qty 100

## 2018-12-15 MED ORDER — POTASSIUM CHLORIDE 20 MEQ/15ML (10%) PO SOLN
40.0000 meq | Freq: Two times a day (BID) | ORAL | Status: AC
  Administered 2018-12-15 (×2): 40 meq via ORAL
  Filled 2018-12-15 (×2): qty 30

## 2018-12-15 MED ORDER — PHENOBARBITAL 20 MG/5ML PO ELIX: 64.8000 mg | ORAL_SOLUTION | Freq: Two times a day (BID) | ORAL | Status: DC

## 2018-12-15 MED ORDER — VITAL AF 1.2 CAL PO LIQD
1000.0000 mL | ORAL | Status: DC
  Administered 2018-12-15 – 2018-12-19 (×7): 1000 mL
  Filled 2018-12-15 (×2): qty 1000

## 2018-12-15 MED ORDER — LEVETIRACETAM 100 MG/ML PO SOLN
1000.0000 mg | Freq: Two times a day (BID) | ORAL | Status: DC
  Administered 2018-12-15 – 2018-12-20 (×10): 1000 mg
  Filled 2018-12-15 (×10): qty 10

## 2018-12-15 MED ORDER — MAGNESIUM SULFATE IN D5W 1-5 GM/100ML-% IV SOLN
1.0000 g | Freq: Once | INTRAVENOUS | Status: AC
  Administered 2018-12-15: 1 g via INTRAVENOUS
  Filled 2018-12-15: qty 100

## 2018-12-15 MED ORDER — MIDODRINE HCL 5 MG PO TABS
10.0000 mg | ORAL_TABLET | Freq: Three times a day (TID) | ORAL | Status: DC
  Administered 2018-12-15 – 2018-12-20 (×14): 10 mg
  Filled 2018-12-15 (×13): qty 2

## 2018-12-15 MED ORDER — POTASSIUM CHLORIDE 10 MEQ/50ML IV SOLN
10.0000 meq | INTRAVENOUS | Status: AC
  Administered 2018-12-15 (×3): 10 meq via INTRAVENOUS
  Filled 2018-12-15 (×3): qty 50

## 2018-12-15 MED ORDER — SODIUM CHLORIDE 0.9% IV SOLUTION
Freq: Once | INTRAVENOUS | Status: AC
  Administered 2018-12-15: 23:00:00 via INTRAVENOUS

## 2018-12-15 MED ORDER — JUVEN PO PACK
1.0000 | PACK | Freq: Two times a day (BID) | ORAL | Status: DC
  Administered 2018-12-15 – 2018-12-20 (×10): 1
  Filled 2018-12-15 (×11): qty 1

## 2018-12-15 MED ORDER — PHENOBARBITAL 20 MG/5ML PO ELIX
60.0000 mg | ORAL_SOLUTION | Freq: Two times a day (BID) | ORAL | Status: DC
  Administered 2018-12-15 – 2018-12-20 (×10): 60 mg
  Filled 2018-12-15 (×10): qty 15

## 2018-12-15 NOTE — Progress Notes (Addendum)
NAME:  Carolin Guernseymarilis Scioli, MRN:  [id number], DOB:  03/08/1985, LOS: 1 ADMISSION DATE:  12/14/2018, CONSULTATION DATE:  12/15/18 CHIEF COMPLAINT:  Sepsis  Brief History   [name] with PMH or TBI requiring trach and ventilator dependence who has a large sacral decubitus ulcer, per paper records and report, this wound was apparently debrided at her nursing home and staff found her in with a large volume of blood in the bed which they thought came from the wound. She was brought to [facility] in [place] where there was no further evidence of bleeding, however pt had evidence of UTI, PNA, lactic acidosis and became bradycardic and coded requiring atropine, CPR and cardioversion.  There were no regional ICU beds available, so the patient was transferred to [facility]  History of present illness   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  All history is taken from paper chart and care everywhere in Epic as patient is unresponsive.   On arrival to the ED, no wound bleeding was appreciated, but foley bulb noted to be out of the urethra with hematuria.   ED work-up notable for lactic acidosis of 6.1, leukocytosis of 17.5k, UTI and Hgb of 6.7.   Pt was given 3L IVF, levaquin and 2 units PRBC's and apparently became bradycardic and was given atropine.  When BP dropped, CPR was initiated.  Per notes she had three minutes of chest compression, epix1 and defibrillated and then pt converted back to sinus tachycardia.  CXR showed LUL and RML infiltrates, but noted to have improved compared to study done 11/29/18.  She was started on  Levophed and transferred [facility].     On review of care everywhere pt has been admitted to [facility] earlier this year and found to have retroperitoneal hematoma while anti-coagulated for DVT.   On arrival, pt is tachycardic to 140's, hypotensive and minimally responsive.  Past Medical History   Past Medical History:  Diagnosis Date  . Anoxic brain injury (HCC)   . Chronic respiratory failure with hypoxia (HCC)    Trach/vent dependent   . DVT (deep venous thrombosis) (HCC)    recurrent: off anticoagulation following RP bleed 08/2018.  Marland Kitchen. Persistent vegetative state (HCC)   . Pressure ulcer    decub, back.   . Tracheostomy status (HCC)   . Traumatic brain injury [facility](HCC)      Significant Hospital Events   10/21 Admit to PCCM  Consults:    Procedures:    Significant Diagnostic Tests:  10/21 CT abd/pelvis>>1. Large left-sided retroperitoneal fluid collection, presumably reflective of a resolving retroperitoneal hemorrhage. 2. Bibasilar areas of atelectasis and consolidation in the dependent portions of the lower lobes of the lungs bilaterally (left greater than right), with trace left pleural effusion lying dependently. 3. Multiple borderline enlarged and minimally enlarged mesenteric, retroperitoneal and retrocrural lymph nodes, as above, nonspecific, but favored to be reactive. 4. Multiple deep decubitus ulcers, most significant in the sacral region where there is extension to the underlying bone, and osseous changes of chronic osteomyelitis. 5. Hepatic steatosis. 6. 2 mm nonobstructive calculus in the upper pole collecting system of left kidney.  No ureteral stones or findings of urinary tract obstruction are noted at this time. 7. Additional incidental findings, as above. 10/21 CT head>>1. No acute intracranial abnormalities. 2. Severe age advanced cerebral and cerebellar atrophy with extensive chronic microvascular ischemic changes throughout the cerebral  white matter, as above. 10/21 L UE venous doppler: No evidence of deep vein thrombosis in the upper extremity. However, unable to visualize the basilic, subclavian, IJ. No evidence of superficial vein thrombosis in the upper extremity. However, unable to visualize the basilic, subclavian, IJ. No evidence of thrombosis in the . However, unable to visualize the basilic, subclavian, IJ. This was a limited study.  Micro Data:  10/21 blood osh: + staph, NOS 10/21 BC x2>> 10/21 UC OSH>> gnr 10/21 resp:   Antimicrobials:  Levaquin 10/20  Vanc 10/21> Cefepime 10/21>  Interim history/subjective:  10/21: remains minimally responsive. Now on 3 pressors.  10/22: K reportedly <2 but no recheck for verifying. Was ordered 100meq, I have stopped this and will order stat recheck. Otherwise no reported events. Transfused overnight.   Objective   Blood pressure (!) 111/57, pulse 92, temperature 99.1 F (37.3 C), temperature source Axillary, resp. rate 20, height 5\' 6"  (1.676 m), weight 68.5 kg, SpO2 100 %.    Vent Mode: PRVC FiO2 (%):  [40 %] 40 % Set Rate:  [20 bmp] 20 bmp Vt Set:  [450 mL] 450 mL PEEP:  [5 cmH20] 5 cmH20 Plateau Pressure:  [17 cmH20-20 cmH20] 17 cmH20   Intake/Output Summary (Last 24 hours) at 12/15/2018 0853 Last data filed at 12/15/2018 0730 Gross per 24 hour  Intake 4288.84 ml  Output 1285 ml  Net 3003.84 ml   Filed Weights   12/14/18 0600 12/14/18 1400 12/15/18 0500  Weight: 65 kg 65 kg 68.5 kg     General:  Chronically ill F, trach in place on ventilator, no apparent distress HEENT: MM pink/moist, tracheostomy in place, pupills 6mm equal bilaterally and responsive to light Neuro: minimally  responsive to pain and voice, not following commands CV: s1s2 tachycardic, regular rate and rhythm, no m/r/g PULM:  Decreased air movement bilateral bases, no wheezing or rhonchi GI: soft, bsx4 active  Extremities: cool/dry, + edema, LUE >>than others. Skin: large sacral  decubitus ulcer and L upper back pressure wound as noted below          Resolved Hospital Problem list     Assessment & Plan:   Shock hemorrhagic vs septic (2/2 below) -does have 1 positive blood cx with staph but not otherwise specified.  -also has stigmata of retroperitoneal bleed on ct. IR to respond if potential further intervention may be needed (due to slow nature seems more likely to be venous which will usually tamponade off but we're at day 3-4 of this).  -transfused at osh and here  P: -lactate cleared -Follow blood and urine cultures and cont Vanc/Cefepime -osh sent 1+ blood cx with staph, calling to get more specific data  -f/u Hgb at 1200 -titrate pressors to map >65 -start midodrine GP/staph bacteremia:  -repeat blood cx here ngtd -f/u from osh. -cont abx gnr uti:   -from osh -cont abx.  -f/u cx results as we get them.  L pna, gp/gn:  -noted on cxr -cont tx with vanc/cefepime -resp cx pending  Acute on chronic anemia -baseline Hgb in care everywhere appears to be ~8-10 -admitted in July with Hgb drop and found to have retroperitoneal hematoma (? If fluid in ct now old or acute on chronic)  P: -repeat Hgb pending at 1200 -s/p transfusion overnight.   Large sacral and back decubitus ulcers -possibly the source of pt's ABLA -do not appear cellulitic P: -appreciate wound care  Anoxic brain injury with chronic respiratory failure and vent dependence -unclear what her neurologic baseline is, notes from OSH note spontaneously opening eyes and anxious -largely unresponsive on arrival P: -cth negative -Continue full vent support H/o sz:  -restart home meds.    Prolonged Qtc -Qtc 551 P: -replace mag today -goal mag >2 and K >4 -monitor qt, ekg in am  Lactic acidosis:  -resolved  Hypokalemia:  -awaiting labs for Cr      Best practice:  Diet: NPO, consult nutrition for tf Pain/Anxiety/Delirium protocol (if indicated): Fentanyl VAP  protocol (if indicated): yes DVT prophylaxis: SCD's GI prophylaxis: protonix Glucose control: SSI Mobility: bed-bound Code Status: full Family Communication: phone conversation with mother 10/22 Disposition: ICU  Labs   CBC: Recent Labs  Lab 12/14/18 0625 12/14/18 1111 12/14/18 1954 12/15/18 0256  WBC  --  8.1  --  6.6  HGB 8.5* 7.6* 6.9* 8.1*  HCT 25.0* 25.0* 22.3* 25.8*  MCV  --  88.7  --  88.1  PLT  --  324  --  428*    Basic Metabolic Panel: Recent Labs  Lab 12/14/18 0625 12/14/18 1111 12/15/18 0508  NA 140 140 144  K 3.3* 4.7 <2.0*  CL  --  106 115*  CO2  --  24 22  GLUCOSE  --  91 93  BUN  --  28* 17  CREATININE  --  0.42* <0.30*  CALCIUM  --  8.1* 8.3*  MG  --  2.1 1.8   GFR: CrCl cannot be calculated (This lab value cannot be used to calculate CrCl because it is not a number: <0.30). Recent Labs  Lab 12/14/18 0753 12/14/18 1111 12/14/18 1130 12/14/18 1200 12/15/18 0256  PROCALCITON  --   --  7.07  --   --   WBC  --  8.1  --   --  6.6  LATICACIDVEN 3.0*  --   --  1.9  --     Liver Function Tests: No results for input(s): AST, ALT, ALKPHOS, BILITOT, PROT, ALBUMIN in the last 168 hours. No results for input(s): LIPASE, AMYLASE in the last 168 hours. No results for input(s): AMMONIA in the last 168 hours.  ABG    Component Value Date/Time   PHART 7.437 12/14/2018 0625   PCO2ART 35.1 12/14/2018 0625   PO2ART 144.0 (H) 12/14/2018 0625   HCO3 23.7 12/14/2018 0625   TCO2 25 12/14/2018 0625   O2SAT 99.0 12/14/2018 0625     Coagulation Profile: Recent Labs  Lab 12/14/18 1130  INR 1.5*    Cardiac Enzymes: No results for input(s): CKTOTAL, CKMB, CKMBINDEX, TROPONINI in the last 168 hours.  HbA1C: Hgb A1c MFr Bld  Date/Time Value Ref Range Status  12/14/2018 07:54 PM 4.9 4.8 - 5.6 % Final    Comment:    (NOTE) Pre diabetes:          5.7%-6.4% Diabetes:              >6.4% Glycemic control for   <7.0% adults with diabetes     CBG:  Recent Labs  Lab 12/14/18 1532 12/14/18 1923 12/14/18 2304 12/15/18 0314 12/15/18 0721  GLUCAP 85 92 101* 89 93     CRITICAL CARE Performed by: Briant Sites   Critical care time: The patient is critically  ill with multiple organ systems failure and requires high complexity decision making for assessment and support, frequent evaluation and titration of therapies, application of advanced monitoring technologies and extensive interpretation of multiple databases.  Critical care time 38 mins. This represents my time independent of the NPs time taking care of the pt. This is excluding procedures.      Audria Nine DO After hours pager: [phone]  [facility] 12/15/2018, 8:53 AM

## 2018-12-15 NOTE — Progress Notes (Addendum)
CRITICAL VALUE ALERT  Critical Value:  Potassium <2.0  Date & Time Notied:  12/15/2018 @0618   Provider Notified: Warren Lacy MD  Orders Received/Actions taken: awaiting orders

## 2018-12-15 NOTE — Progress Notes (Signed)
[place] Progress Note Patient Name: Ashlee Wagner DOB: 11/29/1985 MRN: [id number]   Date of Service  12/15/2018  HPI/Events of Note  K+ 2.o  eICU Interventions  Elink electrolyte replacement protocol for K+ ordered        [name] [name] 12/15/2018, 6:42 AM

## 2018-12-15 NOTE — Progress Notes (Signed)
[place] Progress Note Patient Name: Mellie Buccellato DOB: [birth year] MRN: [id number]   Date of Service  12/15/2018  HPI/Events of Note  Acute blood loss anemia due to recent bleeding from her decubitus ulcer.  eICU Interventions  Transfuse 1 unit of PRBC        Deshaun Schou U Kyri Dai 12/15/2018, 10:41 PM

## 2018-12-15 NOTE — Progress Notes (Addendum)
Attempted to reach sister listed as contact for update without success. Will try again later.  Updated mother via phone at 7

## 2018-12-15 NOTE — Progress Notes (Signed)
Nutrition Follow-up  DOCUMENTATION CODES:   Not applicable  INTERVENTION:   Begin TF via PEG:   Vital AF 1.2 at 25 ml/h, increase by 10 ml every 4 hours to goal rate of 65 ml/h (1560 ml per day).   Provides 1872 kcal, 117 gm protein, 1265 ml free water daily.   Juven BID via PEG, each packet provides 80 calories, 8 grams of carbohydrate, 2.5 grams of protein (collagen), 7 grams of L-arginine and 7 grams of L-glutamine; supplement contains CaHMB, Vitamins C, E, B12 and Zinc to promote wound healing.   Continue to monitor and replace electrolytes per MD.  NUTRITION DIAGNOSIS:   Increased nutrient needs related to chronic illness, wound healing as evidenced by estimated needs.  Ongoing   GOAL:   Patient will meet greater than or equal to 90% of their needs  Being addressed with initiation of TF today  MONITOR:   Labs, Skin, I & O's  REASON FOR ASSESSMENT:   Consult Enteral/tube feeding initiation and management  ASSESSMENT:   33 yo female admitted from [facility] in [place] with hemorrhagic shock from recently debrided sacral wound, UTI, PNA, coded at OSH, then transferred to [facility] for further ICU care. PMH includes anoxic brain injury earlier this year from cardiac arrest, persistent vegetative state with trach, vent dependence, large sacral pressure injury, seizure D/O, asthma.  Discussed patient in ICU rounds and with RN today. Received MD Consult for TF initiation and management. OG tube in place.  Patient is currently intubated on ventilator support MV: 8.4 L/min Temp (24hrs), Avg:98.2 F (36.8 C), Min:97.4 F (36.3 C), Max:99.1 F (37.3 C)   Labs reviewed. Potassium 2.2 (L) CBG's: 93-87  Medications reviewed and include folic acid, phenobarbital, KCl, mag sulfate, neosynephrine, vasopressin.   Diet Order:   Diet Order            Diet NPO time specified  Diet effective now              EDUCATION NEEDS:   No education needs have been  identified at this time  Skin:  Skin Assessment: Skin Integrity Issues: Skin Integrity Issues:: DTI, Unstageable DTI: L heel Stage IV: sacrum Unstageable: L ankle Other: 3 full thickness wounds to upper back  Last BM:  10/21 type 7  Height:   Ht Readings from Last 1 Encounters:  12/14/18 5\' 6"  (1.676 m)    Weight:   Wt Readings from Last 1 Encounters:  12/15/18 68.5 kg    Ideal Body Weight:  59.1 kg  BMI:  Body mass index is 24.37 kg/m.  Estimated Nutritional Needs:   Kcal:  1800-1950  Protein:  100-130 gm  Fluid:  >/= 1.8 L    Molli Barrows, RD, LDN, [place] Pager [phone] After Hours Pager [phone]

## 2018-12-16 ENCOUNTER — Inpatient Hospital Stay (HOSPITAL_COMMUNITY): Payer: Medicare Other

## 2018-12-16 DIAGNOSIS — L538 Other specified erythematous conditions: Secondary | ICD-10-CM

## 2018-12-16 DIAGNOSIS — M7989 Other specified soft tissue disorders: Secondary | ICD-10-CM

## 2018-12-16 LAB — CBC
HCT: 25.4 % — ABNORMAL LOW (ref 36.0–46.0)
Hemoglobin: 7.9 g/dL — ABNORMAL LOW (ref 12.0–15.0)
MCH: 27.9 pg (ref 26.0–34.0)
MCHC: 31.1 g/dL (ref 30.0–36.0)
MCV: 89.8 fL (ref 80.0–100.0)
Platelets: 341 10*3/uL (ref 150–400)
RBC: 2.83 MIL/uL — ABNORMAL LOW (ref 3.87–5.11)
RDW: 16.8 % — ABNORMAL HIGH (ref 11.5–15.5)
WBC: 5 10*3/uL (ref 4.0–10.5)
nRBC: 0 % (ref 0.0–0.2)

## 2018-12-16 LAB — BPAM RBC
Blood Product Expiration Date: 202010302359
Blood Product Expiration Date: 202011212359
ISSUE DATE / TIME: 202010212214
ISSUE DATE / TIME: 202010222256
Unit Type and Rh: 5100
Unit Type and Rh: 5100

## 2018-12-16 LAB — TYPE AND SCREEN
ABO/RH(D): O POS
Antibody Screen: NEGATIVE
Unit division: 0
Unit division: 0

## 2018-12-16 LAB — BASIC METABOLIC PANEL
Anion gap: 6 (ref 5–15)
BUN: 17 mg/dL (ref 6–20)
CO2: 20 mmol/L — ABNORMAL LOW (ref 22–32)
Calcium: 8.5 mg/dL — ABNORMAL LOW (ref 8.9–10.3)
Chloride: 115 mmol/L — ABNORMAL HIGH (ref 98–111)
Creatinine, Ser: <0.3 mg/dL — ABNORMAL LOW (ref 0.44–1.00)
Glucose, Bld: 96 mg/dL (ref 70–99)
Potassium: 2.5 mmol/L — CL (ref 3.5–5.1)
Sodium: 141 mmol/L (ref 135–145)

## 2018-12-16 LAB — CULTURE, RESPIRATORY W GRAM STAIN: Gram Stain: NONE SEEN

## 2018-12-16 LAB — PHOSPHORUS
Phosphorus: 1.9 mg/dL — ABNORMAL LOW (ref 2.5–4.6)
Phosphorus: 1.7 mg/dL — ABNORMAL LOW (ref 2.5–4.6)

## 2018-12-16 LAB — GLUCOSE, CAPILLARY
Glucose-Capillary: 93 mg/dL (ref 70–99)
Glucose-Capillary: 95 mg/dL (ref 70–99)
Glucose-Capillary: 88 mg/dL (ref 70–99)
Glucose-Capillary: 82 mg/dL (ref 70–99)
Glucose-Capillary: 84 mg/dL (ref 70–99)
Glucose-Capillary: 88 mg/dL (ref 70–99)

## 2018-12-16 MED ORDER — POTASSIUM CHLORIDE 10 MEQ/50ML IV SOLN
10.0000 meq | INTRAVENOUS | Status: AC
  Administered 2018-12-16 (×3): 10 meq via INTRAVENOUS
  Filled 2018-12-16: qty 50

## 2018-12-16 MED ORDER — POTASSIUM CHLORIDE 20 MEQ/15ML (10%) PO SOLN
40.0000 meq | ORAL | Status: AC
  Administered 2018-12-16 (×3): 40 meq via ORAL
  Filled 2018-12-16 (×3): qty 30

## 2018-12-16 MED ORDER — SODIUM CHLORIDE 0.9 % IV SOLN
1.0000 g | Freq: Three times a day (TID) | INTRAVENOUS | Status: DC
  Administered 2018-12-16 – 2018-12-17 (×4): 1 g via INTRAVENOUS
  Filled 2018-12-16 (×5): qty 1

## 2018-12-16 MED ORDER — POTASSIUM CHLORIDE 10 MEQ/50ML IV SOLN
10.0000 meq | INTRAVENOUS | Status: DC
  Administered 2018-12-16: 10 meq via INTRAVENOUS
  Filled 2018-12-16: qty 50

## 2018-12-16 NOTE — Progress Notes (Signed)
[place] Progress Note Patient Name: Ashlee Wagner DOB: 1985-11-01 MRN: [id number]   Date of Service  12/16/2018  HPI/Events of Note  K+ 2.5  eICU Interventions  Elink electrolyte  Replacement protocol for K+ ordered.        [name] Ogan 12/16/2018, 6:34 AM

## 2018-12-16 NOTE — Progress Notes (Signed)
NAME:  Ashlee Wagner, MRN:  [id number], DOB:  10/20/1985, LOS: 2 ADMISSION DATE:  12/14/2018, CONSULTATION DATE:  12/16/18 CHIEF COMPLAINT:  Sepsis  Brief History   [name] with PMH or TBI requiring trach and ventilator dependence who has a large sacral decubitus ulcer, per paper records and report, this wound was apparently debrided at her nursing home and staff found her in with a large volume of blood in the bed which they thought came from the wound. She was brought to [facility] in [place] where there was no further evidence of bleeding, however pt had evidence of UTI, PNA, lactic acidosis and became bradycardic and coded requiring atropine, CPR and cardioversion.  There were no regional ICU beds available, so the patient was transferred to [facility]  History of present illness   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  All history is taken from paper chart and care everywhere in Epic as patient is unresponsive.   On arrival to the ED, no wound bleeding was appreciated, but foley bulb noted to be out of the urethra with hematuria.   ED work-up notable for lactic acidosis of 6.1, leukocytosis of 17.5k, UTI and Hgb of 6.7.   Pt was given 3L IVF, levaquin and 2 units PRBC's and apparently became bradycardic and was given atropine.  When BP dropped, CPR was initiated.  Per notes she had three minutes of chest compression, epix1 and defibrillated and then pt converted back to sinus tachycardia.  CXR showed LUL and RML infiltrates, but noted to have improved compared to study done 11/29/18.  She was started on  Levophed and [facility].     On review of care everywhere pt has been admitted to [facility] earlier this year and found to have retroperitoneal hematoma while anti-coagulated for DVT.   On arrival, pt is tachycardic to 140's, hypotensive and minimally responsive.  Past Medical History   Past Medical History:  Diagnosis Date  . Anoxic brain injury (HCC)   . Chronic respiratory failure with hypoxia (HCC)    Trach/vent dependent   . DVT (deep venous thrombosis) (HCC)    recurrent: off anticoagulation following RP bleed 08/2018.  Marland Kitchen. Persistent vegetative state (HCC)   . Pressure ulcer    decub, back.   . Tracheostomy status (HCC)   . Traumatic brain injury [facility](HCC)      Significant Hospital Events   10/21 Admit to PCCM  Consults:    Procedures:    Significant Diagnostic Tests:  10/21 CT abd/pelvis>>1. Large left-sided retroperitoneal fluid collection, presumably reflective of a resolving retroperitoneal hemorrhage. 2. Bibasilar areas of atelectasis and consolidation in the dependent portions of the lower lobes of the lungs bilaterally (left greater than right), with trace left pleural effusion lying dependently. 3. Multiple borderline enlarged and minimally enlarged mesenteric, retroperitoneal and retrocrural lymph nodes, as above, nonspecific, but favored to be reactive. 4. Multiple deep decubitus ulcers, most significant in the sacral region where there is extension to the underlying bone, and osseous changes of chronic osteomyelitis. 5. Hepatic steatosis. 6. 2 mm nonobstructive calculus in the upper pole collecting system of left kidney.  No ureteral stones or findings of urinary tract obstruction are noted at this time. 7. Additional incidental findings, as above. 10/21 CT head>>1. No acute intracranial abnormalities. 2. Severe age advanced cerebral and cerebellar atrophy with extensive chronic microvascular ischemic changes throughout the cerebral  white matter, as above. 10/21 L UE venous doppler: No evidence of deep vein thrombosis in the upper extremity. However, unable to visualize the basilic, subclavian, IJ. No evidence of superficial vein thrombosis in the upper extremity. However, unable to visualize the basilic, subclavian, IJ. No evidence of thrombosis in the . However, unable to visualize the basilic, subclavian, IJ. This was a limited study.  Micro Data:  10/21 blood osh: + staph, NOS 10/21 BC x2>> ngtd 10/21 UC OSH>> gnr 10/21 resp: gnr  Antimicrobials:  Levaquin 10/20  Vanc 10/21> Cefepime 10/21>  Interim history/subjective:  10/21: remains minimally responsive. Now on 3 pressors.  10/22: K reportedly <2 but no recheck for verifying. Was ordered 125meq, I have stopped this and will order stat recheck. Otherwise no reported events. Transfused overnight.  10/23: transfused again last pm 1uprbc. Still replacing K as well.   Objective   Blood pressure (!) 122/57, pulse 87, temperature 97.6 F (36.4 C), temperature source Axillary, resp. rate (!) 21, height 5\' 6"  (1.676 m), weight 70.3 kg, SpO2 100 %.    Vent Mode: PRVC FiO2 (%):  [40 %] 40 % Set Rate:  [20 bmp] 20 bmp Vt Set:  [450 mL] 450 mL PEEP:  [5 cmH20] 5 cmH20 Plateau Pressure:  [17 FMB84-66 cmH20] 20 cmH20   Intake/Output Summary (Last 24 hours) at 12/16/2018 0757 Last data filed at 12/16/2018 5993 Gross per 24 hour  Intake 2597.71 ml  Output 1250 ml  Net 1347.71 ml   Filed Weights   12/14/18 1400 12/15/18 0500 12/16/18 0303  Weight: 65 kg 68.5 kg 70.3 kg     General:  Chronically ill F, trach in place on ventilator, no apparent distress HEENT: MM pink/moist, tracheostomy in place, pupills 44mm equal bilaterally and responsive to light Neuro: minimally  responsive to pain and voice, not following commands CV: s1s2 tachycardic, regular rate and rhythm, no m/r/g PULM:  Decreased air movement bilateral bases, no wheezing or rhonchi GI: soft,  bsx4 active  Extremities: cool/dry, + edema, RUE with marked swelling today and redness. LUE with some edema as well.  Skin: large sacral decubitus ulcer and L upper back pressure wound as noted below          Resolved Hospital Problem list     Assessment & Plan:   Shock hemorrhagic vs septic (2/2 below) -does have 1 positive blood cx with staph but not otherwise specified.  -also has stigmata of retroperitoneal bleed on ct. IR feels collection is small and the indolent nature is not likely arterial. If cont to be issue rec repeat ct but no intervention or cta recommended -transfused at osh and here  P: -Follow blood and urine cultures and cont Vanc/Cefepime -osh sent 1+ blood cx with staph, calling to get more specific data  -f/u Hgb at 1200 -titrate pressors to map >65 -cont midodrine GP/staph bacteremia:  -repeat blood cx here ngtd -f/u from osh. -cont abx gnr uti:   -from osh -cont abx.  -f/u cx results as we get them.  L pna, gp/gn:  -noted on cxr -cont tx with vanc/cefepime -resp cx rare gnr  Acute on chronic anemia -baseline Hgb in care everywhere appears to be ~8-10 -admitted in July with Hgb  drop and found to have retroperitoneal hematoma (? If fluid in ct now old or acute on chronic) P: -repeat Hgb in am -s/p transfusion overnight.   Large sacral and back decubitus ulcers -possibly the source of pt's ABLA -do not appear cellulitic P: -appreciate wound care  Anoxic brain injury with chronic respiratory failure and vent dependence -unclear what her neurologic baseline is, notes from OSH note spontaneously opening eyes and anxious -largely unresponsive on arrival P: -cth negative -Continue full vent support H/o sz:  - home meds.    Prolonged Qtc -Qtc 551 P: -replacing K -goal mag >2 and K >4 -monitor qt, resolved on ekg this am.   Lactic acidosis:  -resolved  Hypokalemia:  -replacing  RUE swelling:  -Doppler pending -attempt  elevation -cut off wrist bands      Best practice:  Diet: NPO,  Tf ongoing Pain/Anxiety/Delirium protocol (if indicated): Fentanyl VAP protocol (if indicated): yes DVT prophylaxis: SCD's GI prophylaxis: protonix Glucose control: SSI Mobility: bed-bound Code Status: full Family Communication: phone conversation with mother 10/22 Disposition: ICU  Labs   CBC: Recent Labs  Lab 12/14/18 1111 12/14/18 1954 12/15/18 0256 12/15/18 1136 12/15/18 2049 12/16/18 0509  WBC 8.1  --  6.6  --   --  5.0  HGB 7.6* 6.9* 8.1* 7.4* 7.0* 7.9*  HCT 25.0* 22.3* 25.8* 23.1* 22.6* 25.4*  MCV 88.7  --  88.1  --   --  89.8  PLT 324  --  428*  --   --  341    Basic Metabolic Panel: Recent Labs  Lab 12/14/18 0625 12/14/18 1111 12/15/18 0508 12/15/18 0944 12/15/18 1647 12/15/18 2049 12/16/18 0509  NA 140 140 144 141  --   --  141  K 3.3* 4.7 <2.0* 2.2*  --   --  2.5*  CL  --  106 115* 112*  --   --  115*  CO2  --  24 22 20*  --   --  20*  GLUCOSE  --  91 93 99  --   --  96  BUN  --  28* 17 15  --   --  17  CREATININE  --  0.42* <0.30* <0.30*  --   --  <0.30*  CALCIUM  --  8.1* 8.3* 8.5*  --   --  8.5*  MG  --  2.1 1.8  --   --  2.0  --   PHOS  --   --   --   --  2.0*  --  1.9*   GFR: CrCl cannot be calculated (This lab value cannot be used to calculate CrCl because it is not a number: <0.30). Recent Labs  Lab 12/14/18 0753 12/14/18 1111 12/14/18 1130 12/14/18 1200 12/15/18 0256 12/16/18 0509  PROCALCITON  --   --  7.07  --   --   --   WBC  --  8.1  --   --  6.6 5.0  LATICACIDVEN 3.0*  --   --  1.9  --   --     Liver Function Tests: No results for input(s): AST, ALT, ALKPHOS, BILITOT, PROT, ALBUMIN in the last 168 hours. No results for input(s): LIPASE, AMYLASE in the last 168 hours. No results for input(s): AMMONIA in the last 168 hours.  ABG    Component Value Date/Time   PHART 7.437 12/14/2018 0625   PCO2ART 35.1 12/14/2018 0625   PO2ART 144.0 (H) 12/14/2018 0625    HCO3 23.7 12/14/2018  0625   TCO2 25 12/14/2018 0625   O2SAT 99.0 12/14/2018 0625     Coagulation Profile: Recent Labs  Lab 12/14/18 1130  INR 1.5*    Cardiac Enzymes: No results for input(s): CKTOTAL, CKMB, CKMBINDEX, TROPONINI in the last 168 hours.  HbA1C: Hgb A1c MFr Bld  Date/Time Value Ref Range Status  12/14/2018 07:54 PM 4.9 4.8 - 5.6 % Final    Comment:    (NOTE) Pre diabetes:          5.7%-6.4% Diabetes:              >6.4% Glycemic control for   <7.0% adults with diabetes     CBG: Recent Labs  Lab 12/15/18 1116 12/15/18 1521 12/15/18 1949 12/16/18 0301 12/16/18 0724  GLUCAP 87 84 96 93 95     CRITICAL CARE Performed by: Briant SitesJessica Malaiah Viramontes   Critical care time: The patient is critically ill with multiple organ systems failure and requires high complexity decision making for assessment and support, frequent evaluation and titration of therapies, application of advanced monitoring technologies and extensive interpretation of multiple databases.  Critical care time 40 mins. This represents my time independent of the NPs time taking care of the pt. This is excluding procedures.      Briant SitesJessica Brya Simerly DO After hours pager: [phone]  [facility] 12/16/2018, 7:57 AM

## 2018-12-16 NOTE — Progress Notes (Signed)
Pt came to [facility] w/ #8 portex trach.  Currently has a #8 shiley inner cannula.  Spoke w/ materials who states hospital does not carry portex inner cannula, but can send extra #8 portex trach.

## 2018-12-16 NOTE — Progress Notes (Signed)
Pharmacy Antibiotic Note  Ashlee Wagner is a 33 y.o. female admitted on 12/14/2018 with concern for sepsis. Pharmacy has been consulted for Vancomycin + Meropenem dosing.  The patient initially presented to [facility] and had cultures drawn there initially. The patient had two pediatric bottles drawn instead of the usual 2 sets. Both bottles are positive but growing different species - one with MRSE and the other with MSSE - so likely both contaminants. Discussed with MD and okayed stop of Vancomycin. Urine cultures are growing ESBL E.coli and ESBL Proteus - both sensitive to Meropenem.   Plan: - D/c Vancomycin and Cefepime - Start Meropenem 1g IV every 8 hours - Will continue to follow renal function, culture results, LOT, and antibiotic de-escalation plans   Height: 5\' 6"  (167.6 cm) Weight: 154 lb 15.7 oz (70.3 kg) IBW/kg (Calculated) : 59.3  Temp (24hrs), Avg:97.8 F (36.6 C), Min:97.6 F (36.4 C), Max:98.3 F (36.8 C)  Recent Labs  Lab 12/14/18 0753 12/14/18 1111 12/14/18 1200 12/15/18 0256 12/15/18 0508 12/15/18 0944 12/16/18 0509  WBC  --  8.1  --  6.6  --   --  5.0  CREATININE  --  0.42*  --   --  <0.30* <0.30* <0.30*  LATICACIDVEN 3.0*  --  1.9  --   --   --   --     CrCl cannot be calculated (This lab value cannot be used to calculate CrCl because it is not a number: <0.30).    Allergies  Allergen Reactions  . Acetaminophen Other (See Comments)    Per MAR  . Amoxicillin Other (See Comments)    Hives & difficulty breathing when 5-7 yrs of age. Per pt's mother has tolerated cephalosporins.   . Codeine Other (See Comments)    Per MAR  . Fluzone [Flu Virus Vaccine] Other (See Comments)    Per MAR  . Gabapentin Other (See Comments)    Per MAR  . Iron Sucrose Other (See Comments)    Anaphylactoid  . Pregabalin Other (See Comments)    Per MAR  . Warfarin Other (See Comments)    Per MAR    Antimicrobials this admission: LVQ PTA at  OSH Vanc 10/21 >> Cefepime 10/21 >>   Outside Hospital: 10/21 BCx >>  MRSE + MSSE (took two peds bottles, singles, no sets, did BCID on both bottles) 10/21 UCx >> ESBL E.coli (S-imi, amikacin, erta, mero, macrobid, zosyn, tige, ESBL proteus (ami, erta, gent, mero, pip/tazo)  [facility]: 10/21 MRSA PCR: negative  10/21 trach: rare GNR (reincubated) 10/21 Bcx: ng <24hr 10/21 Ucx:   Thank you for allowing pharmacy to be a part of this patient's care.  Alycia Rossetti, PharmD, BCPS Clinical Pharmacist Clinical phone for 12/16/2018: [phone] 12/16/2018 10:43 AM   **Pharmacist phone directory can now be found on [email or web address] ([email or web address]).  Listed under [place].

## 2018-12-16 NOTE — Progress Notes (Signed)
CRITICAL VALUE ALERT  Critical Value: K+ 2.5  Date & Time Notied: 12/16/2018 @ 0605  Provider Notified: Corliss Skains, RN  Orders Received/Actions taken: See orders

## 2018-12-16 NOTE — Progress Notes (Signed)
Right upper ext venous duplex  has been completed. Refer to [facility] under chart review to view preliminary results.   12/16/2018  11:10 AM Ezma Rehm, Bonnye Fava

## 2018-12-16 NOTE — Progress Notes (Deleted)
Pt continues to desat into low 80s when sleeping. Currently on heated HFNC @100%, 20L.  SpO2 does come back up into low 90s when awake and talking, however pt lethargic.  Relayed to RT and CCM MD.  Will continue to monitor closely for now, will reassess if pt becomes more lethargic.  

## 2018-12-16 NOTE — Progress Notes (Signed)
Spoke w/ pts mom to provide updates. Pts mom appreciative.  

## 2018-12-16 NOTE — Progress Notes (Signed)
1 unit of PRBC infused as ordered. Will await H&H results from CBC for morning labs as these times will be very close to each other.

## 2018-12-17 ENCOUNTER — Inpatient Hospital Stay (HOSPITAL_COMMUNITY): Payer: Medicare Other

## 2018-12-17 DIAGNOSIS — J9610 Chronic respiratory failure, unspecified whether with hypoxia or hypercapnia: Secondary | ICD-10-CM

## 2018-12-17 LAB — BASIC METABOLIC PANEL
Anion gap: 6 (ref 5–15)
BUN: 14 mg/dL (ref 6–20)
CO2: 20 mmol/L — ABNORMAL LOW (ref 22–32)
Calcium: 8.5 mg/dL — ABNORMAL LOW (ref 8.9–10.3)
Chloride: 116 mmol/L — ABNORMAL HIGH (ref 98–111)
Creatinine, Ser: <0.3 mg/dL — ABNORMAL LOW (ref 0.44–1.00)
Glucose, Bld: 90 mg/dL (ref 70–99)
Potassium: 3.9 mmol/L (ref 3.5–5.1)
Sodium: 142 mmol/L (ref 135–145)

## 2018-12-17 LAB — CBC
HCT: 30.7 % — ABNORMAL LOW (ref 36.0–46.0)
HCT: 27.3 % — ABNORMAL LOW (ref 36.0–46.0)
Hemoglobin: 9.5 g/dL — ABNORMAL LOW (ref 12.0–15.0)
Hemoglobin: 8.7 g/dL — ABNORMAL LOW (ref 12.0–15.0)
MCH: 27.6 pg (ref 26.0–34.0)
MCH: 28.3 pg (ref 26.0–34.0)
MCHC: 30.9 g/dL (ref 30.0–36.0)
MCHC: 31.9 g/dL (ref 30.0–36.0)
MCV: 89.2 fL (ref 80.0–100.0)
MCV: 88.9 fL (ref 80.0–100.0)
Platelets: 93 10*3/uL — ABNORMAL LOW (ref 150–400)
Platelets: 371 10*3/uL (ref 150–400)
RBC: 3.44 MIL/uL — ABNORMAL LOW (ref 3.87–5.11)
RBC: 3.07 MIL/uL — ABNORMAL LOW (ref 3.87–5.11)
RDW: 17.8 % — ABNORMAL HIGH (ref 11.5–15.5)
RDW: 17.8 % — ABNORMAL HIGH (ref 11.5–15.5)
WBC: 3.3 10*3/uL — ABNORMAL LOW (ref 4.0–10.5)
WBC: 5.7 10*3/uL (ref 4.0–10.5)
nRBC: 0 % (ref 0.0–0.2)
nRBC: 0 % (ref 0.0–0.2)

## 2018-12-17 LAB — GLUCOSE, CAPILLARY
Glucose-Capillary: 87 mg/dL (ref 70–99)
Glucose-Capillary: 83 mg/dL (ref 70–99)
Glucose-Capillary: 95 mg/dL (ref 70–99)
Glucose-Capillary: 95 mg/dL (ref 70–99)
Glucose-Capillary: 96 mg/dL (ref 70–99)

## 2018-12-17 MED ORDER — PIPERACILLIN-TAZOBACTAM 3.375 G IVPB
3.3750 g | Freq: Three times a day (TID) | INTRAVENOUS | Status: DC
  Administered 2018-12-17 – 2018-12-19 (×6): 3.375 g via INTRAVENOUS
  Filled 2018-12-17 (×7): qty 50

## 2018-12-17 MED ORDER — SENNOSIDES 8.8 MG/5ML PO SYRP
5.0000 mL | ORAL_SOLUTION | Freq: Two times a day (BID) | ORAL | Status: DC
  Administered 2018-12-17 – 2018-12-20 (×4): 5 mL
  Filled 2018-12-17 (×4): qty 5

## 2018-12-17 NOTE — Progress Notes (Addendum)
NAME:  Ashlee Wagner, MRN:  [id number], DOB:  07/25/1985, LOS: 3 ADMISSION DATE:  12/14/2018, CONSULTATION DATE:  12/17/18 CHIEF COMPLAINT:  Sepsis  Brief History   [name] with PMH or TBI requiring trach and ventilator dependence who has a large sacral decubitus ulcer, per paper records and report, this wound was apparently debrided at her nursing home and staff found her in with a large volume of blood in the bed which they thought came from the wound. She was [facility] in [place] where there was no further evidence of bleeding, however pt had evidence of UTI, PNA, lactic acidosis and became bradycardic and coded requiring atropine, CPR and cardioversion.  There were no regional ICU beds available, so the patient was transferred to [facility].  History of present illness   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  All history is taken from paper chart and care everywhere in Epic as patient is unresponsive.   On arrival to the ED, no wound bleeding was appreciated, but foley bulb noted to be out of the urethra with hematuria.   ED work-up notable for lactic acidosis of 6.1, leukocytosis of 17.5k, UTI and Hgb of 6.7.   Pt was given 3L IVF, levaquin and 2 units PRBC's and apparently became bradycardic and was given atropine.  When BP dropped, CPR was initiated.  Per notes she had three minutes of chest compression, epix1 and defibrillated and then pt converted back to sinus tachycardia.  CXR showed LUL and RML infiltrates, but noted to have improved compared to study done 11/29/18.  She was started on  Levophed and transferred to [facility].     On review of care everywhere pt has been admitted to [facility] earlier this year and found to have retroperitoneal hematoma while anti-coagulated for DVT.   On arrival, pt is tachycardic to 140's, hypotensive and minimally responsive.  Past Medical History   Past Medical History:  Diagnosis Date   Anoxic brain injury (HCC)    Chronic respiratory failure with hypoxia (HCC)    Trach/vent dependent    DVT (deep venous thrombosis) (HCC)    recurrent: off anticoagulation following RP bleed 08/2018.   Persistent vegetative state (HCC)    Pressure ulcer    decub, back.    Tracheostomy status (HCC)    Traumatic brain injury (HCC)      Significant Hospital Events   10/21 Admit to PCCM  Consults:    Procedures:  10/22: G Tube replaced in IR  Significant Diagnostic Tests:  10/21 CT abd/pelvis>>1. Large left-sided retroperitoneal fluid collection, presumably reflective of a resolving retroperitoneal hemorrhage. 2. Bibasilar areas of atelectasis and consolidation in the dependent portions of the lower lobes of the lungs bilaterally (left greater than right), with trace left pleural effusion lying dependently. 3. Multiple borderline enlarged and minimally enlarged mesenteric, retroperitoneal and retrocrural lymph nodes, as above, nonspecific, but favored to be reactive. 4. Multiple deep decubitus ulcers, most significant in the sacral region where there is extension to the underlying bone, and osseous changes of chronic osteomyelitis. 5. Hepatic steatosis. 6. 2 mm nonobstructive calculus in the upper pole  collecting system of left kidney. No ureteral stones or findings of urinary tract obstruction are noted at this time. 7. Additional incidental findings, as above. 10/21 CT head>>1. No acute intracranial abnormalities. 2. Severe age advanced cerebral and cerebellar atrophy with extensive chronic microvascular ischemic  changes throughout the cerebral white matter, as above. 10/21 L UE venous doppler: No evidence of deep vein thrombosis in the upper extremity. However, unable to visualize the basilic, subclavian, IJ. No evidence of superficial vein thrombosis in the upper extremity. However, unable to visualize the basilic, subclavian, IJ. No evidence of thrombosis in the . However, unable to visualize the basilic, subclavian, IJ. This was a limited study.  Micro Data:  10/21 blood osh: + staph, NOS 10/21 BC x2>> ngtd 10/21 UC OSH>> gnr 10/21 resp cx: Proteus, Pseudomonas, Providencia stuartii \  Antimicrobials:  Levaquin 10/20  Vanc 10/21> 10/23 Cefepime 10/21> 10/23 Mero 10/23>>  Interim history/subjective:  Abx escalated to [employer].  Multiple bacteria growing in respiratory culture with resistance.  Off all vasopressors Grimaces with palpation of abdomen  Objective   Blood pressure (!) 99/47, pulse 79, temperature 98.1 F (36.7 C), temperature source Axillary, resp. rate 20, height 5\' 6"  (1.676 m), weight 71.3 kg, SpO2 100 %.    Vent Mode: PRVC FiO2 (%):  [40 %] 40 % Set Rate:  [20 bmp] 20 bmp Vt Set:  [450 mL] 450 mL PEEP:  [5 cmH20] 5 cmH20 Plateau Pressure:  [16 cmH20-22 cmH20] 17 cmH20   Intake/Output Summary (Last 24 hours) at 12/17/2018 0908 Last data filed at 12/17/2018 0600 Gross per 24 hour  Intake 1896.37 ml  Output 1750 ml  Net 146.37 ml   Filed Weights   12/15/18 0500 12/16/18 0303 12/17/18 0500  Weight: 68.5 kg 70.3 kg 71.3 kg     General:  Female, trach on ventilator HEENT: [place]/AT, mucous membranes most.  Neuro:opens eyes spontaneously, does not follow commands, contracted in extremities.  CV: SR on telemetry. Warm and well perfused. + edema in extremities. PULM:  Decreased breath sounds, minimal secretions via trach GI: soft, + tender, non distended Extremities: No rash Skin: large sacral decubitus ulcer and L upper back pressure wound   Resolved Hospital Problem  list   Lactic acidosis:   Assessment & Plan:   Shock hemorrhagic vs septic (2/2 below), resolved -does have 1 positive blood cx with staph but not otherwise specified.  -also has stigmata of retroperitoneal bleed on ct. IR feels collection is small and the indolent nature is not likely arterial. If cont to be issue rec repeat ct but no intervention or cta recommended -Off vasopressors since 10/23 P: -Continue home midodrine  GP/staph bacteremia: ? contaminate -Follow blood and urine cultures from OSH: OSH records with  1+ blood cx with staph -Repeat Blood Cx here NGTD  GNR UTI   -Cx from OSH. Will call today -Continue abx  L PNA: Proteus, Pseudomonas, Providencia stuartii  -Multiple resistances -Abx adjusted to [facility] 10/23.  Adjust to Zosyn today   Acute on chronic anemia -baseline Hgb in care everywhere appears to be ~8-10 -admitted in July with Hgb drop and found to have retroperitoneal hematoma (? If fluid in ct now old or acute on chronic) P: -Hgb stable this am without indication for transfusion. Continue to monitor    Large sacral and back decubitus ulcers P: -Wound care following  Anoxic brain injury with chronic respiratory failure and vent dependence -unclear what her neurologic baseline is, notes from OSH note spontaneously opening  eyes and anxious -unresponsive on arrival -Today: Eyes open spontaneously, not following commands P: -Continue full vent support, wean as tolerated.   H/o sz:  - home meds: Phenobarb, Keppra  Prolonged Qtc -Qtc 551 P: -Repeat EKG  Thrombocytopenia -Plt count down to 93.   -4T score 3: Low probability -Repeat CBC this afternoon to follow plt count  Abdominal tenderness P: -Grimaces with abdominal palpation, no stool since arrival. Will obtain KUB and start bowel regimen  Best practice:  Diet: NPO,  Tf ongoing Pain/Anxiety/Delirium protocol (if indicated): Fentanyl VAP protocol (if indicated): yes DVT prophylaxis:  SCD's GI prophylaxis: protonix Glucose control: SSI Mobility: bed-bound Code Status: full Family Communication: phone conversation with mother 10/22. Will follow up today  Disposition: ICU  Labs   CBC: Recent Labs  Lab 12/14/18 1111  12/15/18 0256 12/15/18 1136 12/15/18 2049 12/16/18 0509 12/17/18 0411  WBC 8.1  --  6.6  --   --  5.0 3.3*  HGB 7.6*   < > 8.1* 7.4* 7.0* 7.9* 9.5*  HCT 25.0*   < > 25.8* 23.1* 22.6* 25.4* 30.7*  MCV 88.7  --  88.1  --   --  89.8 89.2  PLT 324  --  428*  --   --  341 93*   < > = values in this interval not displayed.    Basic Metabolic Panel: Recent Labs  Lab 12/14/18 1111 12/15/18 0508 12/15/18 0944 12/15/18 1647 12/15/18 2049 12/16/18 0509 12/16/18 1653 12/17/18 0411  NA 140 144 141  --   --  141  --  142  K 4.7 <2.0* 2.2*  --   --  2.5*  --  3.9  CL 106 115* 112*  --   --  115*  --  116*  CO2 24 22 20*  --   --  20*  --  20*  GLUCOSE 91 93 99  --   --  96  --  90  BUN 28* 17 15  --   --  17  --  14  CREATININE 0.42* <0.30* <0.30*  --   --  <0.30*  --  <0.30*  CALCIUM 8.1* 8.3* 8.5*  --   --  8.5*  --  8.5*  MG 2.1 1.8  --   --  2.0  --   --   --   PHOS  --   --   --  2.0*  --  1.9* 1.7*  --    GFR: CrCl cannot be calculated (This lab value cannot be used to calculate CrCl because it is not a number: <0.30). Recent Labs  Lab 12/14/18 0753 12/14/18 1111 12/14/18 1130 12/14/18 1200 12/15/18 0256 12/16/18 0509 12/17/18 0411  PROCALCITON  --   --  7.07  --   --   --   --   WBC  --  8.1  --   --  6.6 5.0 3.3*  LATICACIDVEN 3.0*  --   --  1.9  --   --   --     Liver Function Tests: No results for input(s): AST, ALT, ALKPHOS, BILITOT, PROT, ALBUMIN in the last 168 hours. No results for input(s): LIPASE, AMYLASE in the last 168 hours. No results for input(s): AMMONIA in the last 168 hours.  ABG    Component Value Date/Time   PHART 7.437 12/14/2018 0625   PCO2ART 35.1 12/14/2018 0625   PO2ART 144.0 (H) 12/14/2018 0625    HCO3 23.7 12/14/2018 0625   TCO2 25  12/14/2018 0625   O2SAT 99.0 12/14/2018 0625     Coagulation Profile: Recent Labs  Lab 12/14/18 1130  INR 1.5*    Cardiac Enzymes: No results for input(s): CKTOTAL, CKMB, CKMBINDEX, TROPONINI in the last 168 hours.  HbA1C: Hgb A1c MFr Bld  Date/Time Value Ref Range Status  12/14/2018 07:54 PM 4.9 4.8 - 5.6 % Final    Comment:    (NOTE) Pre diabetes:          5.7%-6.4% Diabetes:              >6.4% Glycemic control for   <7.0% adults with diabetes     CBG: Recent Labs  Lab 12/16/18 1514 12/16/18 1943 12/16/18 2326 12/17/18 0327 12/17/18 0721  GLUCAP 82 84 88 87 83   CCT: 42 min  Paulita Fujita, ACNP [facility]  Pager: [phone]    ATTESTATION & SIGNATURE   STAFF NOTE: I, Dr Ann Lions have personally reviewed patient's available data, including medical history, events of note, physical examination and test results as part of my evaluation. I have discussed with resident/NP and other care providers such as pharmacist, RN and RRT.  In addition,  I personally evaluated patient and elicited key findings of   S: Date of admit 12/14/2018 with LOS 3 for today 12/17/2018 : Ashlee Wagner is  -remains on the ventilator on 40% FiO2.  She is not on pressors.  She is not on sedation drip.  She has stopped bleeding.  She does not require any blood today.  She remains on continuous PRVC which apparently is her baseline according to the nurse.  EKG today has shown the QTC prolongation is resolved  O:  Blood pressure 106/64, pulse 87, temperature 98 F (36.7 C), temperature source Axillary, resp. rate (!) 25, height 5\' 6"  (1.676 m), weight 71.3 kg, SpO2 100 %.   Cachectic female with contractures on the ventilator with tracheostomy. Synchronous with the ventilator Eyes open    A: Chronic respiratory failure status post tracheostomy secondary to remote cardiac arrest -ventilator dependent Resolved QTC  prolongation  Sacral decub wounds on antibiotic  P: Continue ventilator support Moved to progressive Pulmonary medicine will follow a few times a week -tried hospitalist primary from December 18, 2018   Anti-infectives (From admission, onward)   Start     Dose/Rate Route Frequency Ordered Stop   12/17/18 1200  piperacillin-tazobactam (ZOSYN) IVPB 3.375 g     3.375 g 12.5 mL/hr over 240 Minutes Intravenous Every 8 hours 12/17/18 1029     12/16/18 1000  meropenem (MERREM) 1 g in sodium chloride 0.9 % 100 mL IVPB  Status:  Discontinued     1 g 200 mL/hr over 30 Minutes Intravenous Every 8 hours 12/16/18 0914 12/17/18 1029   12/14/18 2100  vancomycin (VANCOCIN) IVPB 1000 mg/200 mL premix  Status:  Discontinued     1,000 mg 200 mL/hr over 60 Minutes Intravenous Every 12 hours 12/14/18 1527 12/16/18 1042   12/14/18 2000  ceFEPIme (MAXIPIME) 2 g in sodium chloride 0.9 % 100 mL IVPB  Status:  Discontinued     2 g 200 mL/hr over 30 Minutes Intravenous Every 8 hours 12/14/18 1711 12/16/18 0914   12/14/18 0800  vancomycin (VANCOCIN) 1,500 mg in sodium chloride 0.9 % 500 mL IVPB     1,500 mg 250 mL/hr over 120 Minutes Intravenous  Once 12/14/18 0718 12/14/18 1037       Rest per NP/medical resident whose note is outlined  above and that I agree with   Dr. Kalman Shan, M.D., [facility].C.P Pulmonary and Critical Care Medicine Staff Physician [facility] [place] [facility] Pager: [phone], If no answer or between  15:00h - 7:00h: call [phone]  12/17/2018 3:40 PM

## 2018-12-18 DIAGNOSIS — J9612 Chronic respiratory failure with hypercapnia: Secondary | ICD-10-CM

## 2018-12-18 LAB — BASIC METABOLIC PANEL
Anion gap: 7 (ref 5–15)
Anion gap: 9 (ref 5–15)
BUN: 15 mg/dL (ref 6–20)
BUN: 9 mg/dL (ref 6–20)
CO2: 24 mmol/L (ref 22–32)
CO2: 23 mmol/L (ref 22–32)
Calcium: 7.7 mg/dL — ABNORMAL LOW (ref 8.9–10.3)
Calcium: 7.5 mg/dL — ABNORMAL LOW (ref 8.9–10.3)
Chloride: 107 mmol/L (ref 98–111)
Chloride: 106 mmol/L (ref 98–111)
Creatinine, Ser: <0.3 mg/dL — ABNORMAL LOW (ref 0.44–1.00)
Creatinine, Ser: <0.3 mg/dL — ABNORMAL LOW (ref 0.44–1.00)
Glucose, Bld: 96 mg/dL (ref 70–99)
Glucose, Bld: 88 mg/dL (ref 70–99)
Potassium: 3 mmol/L — ABNORMAL LOW (ref 3.5–5.1)
Potassium: 4.1 mmol/L (ref 3.5–5.1)
Sodium: 138 mmol/L (ref 135–145)
Sodium: 138 mmol/L (ref 135–145)

## 2018-12-18 LAB — GLUCOSE, CAPILLARY
Glucose-Capillary: 93 mg/dL (ref 70–99)
Glucose-Capillary: 93 mg/dL (ref 70–99)
Glucose-Capillary: 94 mg/dL (ref 70–99)
Glucose-Capillary: 95 mg/dL (ref 70–99)
Glucose-Capillary: 95 mg/dL (ref 70–99)
Glucose-Capillary: 96 mg/dL (ref 70–99)

## 2018-12-18 LAB — HEPATIC FUNCTION PANEL
ALT: 22 U/L (ref 0–44)
AST: 14 U/L — ABNORMAL LOW (ref 15–41)
Albumin: 1.3 g/dL — ABNORMAL LOW (ref 3.5–5.0)
Alkaline Phosphatase: 248 U/L — ABNORMAL HIGH (ref 38–126)
Bilirubin, Direct: <0.1 mg/dL (ref 0.0–0.2)
Total Bilirubin: 0.2 mg/dL — ABNORMAL LOW (ref 0.3–1.2)
Total Protein: 5.3 g/dL — ABNORMAL LOW (ref 6.5–8.1)

## 2018-12-18 LAB — PHOSPHORUS
Phosphorus: <1 mg/dL — CL (ref 2.5–4.6)
Phosphorus: 3.2 mg/dL (ref 2.5–4.6)

## 2018-12-18 LAB — PROTIME-INR
INR: 1.1 (ref 0.8–1.2)
Prothrombin Time: 14.4 seconds (ref 11.4–15.2)

## 2018-12-18 LAB — CBC
HCT: 25.5 % — ABNORMAL LOW (ref 36.0–46.0)
Hemoglobin: 7.9 g/dL — ABNORMAL LOW (ref 12.0–15.0)
MCH: 27.7 pg (ref 26.0–34.0)
MCHC: 31 g/dL (ref 30.0–36.0)
MCV: 89.5 fL (ref 80.0–100.0)
Platelets: 299 10*3/uL (ref 150–400)
RBC: 2.85 MIL/uL — ABNORMAL LOW (ref 3.87–5.11)
RDW: 17.8 % — ABNORMAL HIGH (ref 11.5–15.5)
WBC: 4.9 10*3/uL (ref 4.0–10.5)
nRBC: 0 % (ref 0.0–0.2)

## 2018-12-18 LAB — MAGNESIUM: Magnesium: 1.3 mg/dL — ABNORMAL LOW (ref 1.7–2.4)

## 2018-12-18 MED ORDER — MAGNESIUM SULFATE 2 GM/50ML IV SOLN
2.0000 g | Freq: Once | INTRAVENOUS | Status: AC
  Administered 2018-12-18: 2 g via INTRAVENOUS
  Filled 2018-12-18: qty 50

## 2018-12-18 MED ORDER — POTASSIUM PHOSPHATES 15 MMOLE/5ML IV SOLN
30.0000 mmol | Freq: Once | INTRAVENOUS | Status: AC
  Administered 2018-12-18: 30 mmol via INTRAVENOUS
  Filled 2018-12-18: qty 10

## 2018-12-18 MED ORDER — ENOXAPARIN SODIUM 40 MG/0.4ML ~~LOC~~ SOLN
40.0000 mg | SUBCUTANEOUS | Status: DC
  Administered 2018-12-18 – 2018-12-19 (×2): 40 mg via SUBCUTANEOUS
  Filled 2018-12-18 (×2): qty 0.4

## 2018-12-18 NOTE — Progress Notes (Signed)
[facility] Progress Note Patient Name: Bobette Leyh DOB: 1985-07-01 MRN: [id number]   Date of Service  12/18/2018  HPI/Events of Note  K+ = 3.0, PO4--- < 1.0, Mg++ = 1.3 and Creatinine < 0.30.  eICU Interventions  Will order: 1. Replace K+, PO4--- and Mg++. 3. BMP and PO4--- at 10 AM.     Intervention Category Major Interventions: Electrolyte abnormality - evaluation and management  Talena Neira Eugene 12/18/2018, 2:00 AM

## 2018-12-18 NOTE — Progress Notes (Signed)
CRITICAL VALUE ALERT  Critical Value: Phosphorus <1.0  Date & Time Notied: 12/18/2018 @ 8592  Provider Notified: Dreama Saa, RN  Orders Received/Actions taken: See orders

## 2018-12-18 NOTE — Progress Notes (Signed)
PROGRESS NOTE    Ashlee Wagner  HQP:[id number] DOB: [birth year] DOA: 12/14/2018 PCP: Sherrlyn Hock, MD   Brief Narrative:  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 She was brought to [facility] in [place] where there was no further evidence of bleeding, however pt had evidence of UTI, PNA, lactic acidosis of 6.1, leukocytosis of 17.5, hemoglobin of 6.7. She was given 3 L of IV fluid, Levaquin and 2 unit PRBC transfusion and became bradycardic and coded requiring atropine, CPR and cardioversion. Per notes she had three minutes of chest compression, epix1 and defibrillated and then pt converted back to sinus tachycardia.  CXR showed LUL and RML infiltrates, but noted to have improved compared to study done 11/29/18.  She was started on Levophed and transferred to [facility].     Assessment & Plan:   Active Problems:   Sepsis ([place])   Anoxic brain injury ([place])   Ventilator dependence ([place])   Cardiac arrest ([place])   Prolonged Q-T interval on ECG   Hemorrhagic shock (HCC)   Septic shock (HCC)   Pneumonia of left lung due to infectious organism   Sacral decubitus ulcer, stage IV (HCC)   Chronic respiratory failure (HCC)   Shock hemorrhagic vs septic (2/2 below)/?  Gram-positive/staph bacteremia/ HCAP: -does have 1 positive blood cx with staph but not otherwise specified.  -also has stigmata of retroperitoneal bleed on ct. IR feels collection is small and the indolent nature is not likely arterial. If cont to be issue rec repeat ct but no intervention or cta recommended -Off vasopressors since 10/23.  Respiratory culture growing  Proteus mirabilis, Pseudomonas, providentia stuartti.  Antibiotics have been broadened to meropenem.  She is afebrile.  No leukocytosis.  Continue current antibiotics.  Blood pressure stable.  Continue midodrine.  GNR UTI   -Cx from OSH.  -Continue abx  Acute on chronic anemia -baseline Hgb in care everywhere appears to be ~8-10 -admitted in July with Hgb drop and found to have retroperitoneal hematoma (? If fluid in ct now old or acute on chronic).  Hemoglobin dropped to 6.9 on 12/14/2018.  Received transfusion.  Hemoglobin has remained stable around 8 since then.  Large sacral and back decubitus ulcers P: -Wound care following  Anoxic brain injury with chronic respiratory failure and vent dependence -unclear what her neurologic baseline is, notes from OSH note spontaneously opening eyes and anxious and she seems to be doing the same here.  Full vent support by PCCM.  H/o sz:  - home meds: Phenobarb, Keppra  Prolonged Qtc -Resolved.  EKG on 12/17/2018 shows Qtc 449  Thrombocytopenia:.  Likely due to sepsis.  Abdominal tenderness/?  Ileus.  Monitor with support.  Repeat x-ray tomorrow.  DVT prophylaxis: We will start on Lovenox Code Status: Full code Family Communication: None present at bedside.  Disposition Plan: TBD  Estimated body mass index is 25.55 kg/m as calculated from the following:   Height as of this encounter: _0  (1.676 m).   Weight as of this encounter: 71.8 kg.  Pressure Injury 12/14/18 Sacrum Mid Stage IV - Full thickness tissue loss with exposed bone, tendon or  muscle. (Active)  12/14/18   Location: Sacrum  Location Orientation: Mid  Staging: Stage IV - Full thickness tissue loss with exposed bone, tendon or muscle.  Wound Description (Comments):   Present on Admission: Yes     Pressure Injury 12/14/18 Heel Left Deep Tissue Injury - Purple or maroon localized area of discolored intact skin or blood-filled blister due to damage of underlying  soft tissue from pressure and/or shear. (Active)  12/14/18   Location: Heel  Location Orientation: Left  Staging: Deep Tissue Injury - Purple or maroon localized area of discolored intact skin or blood-filled blister due to damage of underlying soft tissue from pressure and/or shear.  Wound Description (Comments):   Present on Admission: Yes     Pressure Injury 12/14/18 Ankle Left Unstageable - Full thickness tissue loss in which the base of the ulcer is covered by slough (yellow, tan, gray, green or brown) and/or eschar (tan, brown or black) in the wound bed. (Active)  12/14/18   Location: Ankle  Location Orientation: Left  Staging: Unstageable - Full thickness tissue loss in which the base of the ulcer is covered by slough (yellow, tan, gray, green or brown) and/or eschar (tan, brown or black) in the wound bed.  Wound Description (Comments):   Present on Admission: Yes     Nutritional status:  Nutrition Problem: Increased nutrient needs Etiology: chronic illness, wound healing   Signs/Symptoms: estimated needs   Interventions: Refer to RD note for recommendations    Consultants:   PCCM  Procedures:   None  Antimicrobials:  Levaquin 10/20  Vanc 10/21> 10/23 Cefepime 10/21> 10/23 Mero 10/23>>   Subjective: Patient seen and examined in ICU.  She is trach and vent dependent.  She grimaces with is nonverbal.  Looks comfortable.  Objective: Vitals:   12/18/18 0900 12/18/18 1000 12/18/18 1100 12/18/18 1304  BP: (!) 109/51 (!) 117/55 (!) 111/44 114/82  Pulse: 85 91 99 95  Resp: (!) 22 (!) 24 (!) 25 (!) 23  Temp:      TempSrc:      SpO2: 100% 100% 100% 100%  Weight:      Height:        Intake/Output Summary (Last 24 hours) at 12/18/2018 1424 Last data filed at 12/18/2018 1200 Gross per 24 hour  Intake 2213.65 ml  Output 2550 ml  Net -336.35 ml   Filed Weights   12/16/18 0303 12/17/18 0500 12/18/18 0427  Weight: 70.3 kg 71.3 kg 71.8 kg    Examination:   General exam: Appears calm and comfortable on trach and vent Respiratory system: Coarse breath sounds bilaterally.  On trach and vent Cardiovascular system: S1 & S2 heard, RRR. No JVD, murmurs, rubs, gallops or clicks. No pedal edema.  Has +1 pitting edema bilateral upper extremities bilaterally Gastrointestinal system: Abdomen is nondistended, soft and generalized mild tenderness. No organomegaly or masses felt. Normal bowel sounds heard. Central nervous system: Opens eyes spontaneously, grimaces.  Contracted in lower extremities. Extremities: Symmetric 5 x 5 power. Skin: Large sacral decubitus ulcer.  Dressing in place.   Data Reviewed: I have personally reviewed following labs and imaging studies  CBC: Recent Labs  Lab 12/15/18 0256  12/15/18 2049 12/16/18 0509 12/17/18 0411 12/17/18 1548 12/18/18 0050  WBC 6.6  --   --  5.0 3.3* 5.7 4.9  HGB 8.1*   < > 7.0* 7.9* 9.5* 8.7* 7.9*  HCT 25.8*   < > 22.6* 25.4* 30.7* 27.3* 25.5*  MCV 88.1  --   --  89.8 89.2 88.9 89.5  PLT 428*  --   --  341 93* 371 299   < > = values in this interval not displayed.   Basic Metabolic Panel: Recent Labs  Lab 12/14/18 1111 12/15/18 0508 12/15/18 0944 12/15/18 1647 12/15/18 2049 12/16/18 0509 12/16/18 1653 12/17/18 0411 12/18/18 0050 12/18/18 1013  NA 140 144 141  --   --  141  --  142 138 138  K 4.7 <2.0* 2.2*  --   --  2.5*  --  3.9 3.0* 4.1  CL 106 115* 112*  --   --  115*  --  116* 107 106  CO2 24 22 20*  --   --  20*  --  20* 24 23  GLUCOSE 91 93 99  --   --  96  --  90 96 88  BUN 28* 17 15  --   --  17  --  _0 CREATININE 0.42* <0.30* <0.30*  --   --  <0.30*  --  <0.30* <0.30* <0.30*  CALCIUM 8.1* 8.3* 8.5*  --   --  8.5*  --  8.5* 7.7* 7.5*  MG 2.1 1.8  --   --  2.0  --   --   --  1.3*  --   PHOS  --   --   --  2.0*  --  1.9* 1.7*  --  <1.0* 3.2   GFR: CrCl cannot be calculated (This lab value cannot be used to calculate CrCl because it is not a number: <0.30). Liver  Function Tests: Recent Labs  Lab 12/18/18 0050  AST 14*  ALT 22  ALKPHOS 248*  BILITOT 0.2*  PROT 5.3*  ALBUMIN 1.3*   No results for input(s): LIPASE, AMYLASE in the last 168 hours. No results for input(s): AMMONIA in the last 168 hours. Coagulation Profile: Recent Labs  Lab 12/14/18 1130 12/18/18 0050  INR 1.5* 1.1   Cardiac Enzymes: No results for input(s): CKTOTAL, CKMB, CKMBINDEX, TROPONINI in the last 168 hours. BNP (last 3 results) No results for input(s): PROBNP in the last 8760 hours. HbA1C: No results for input(s): HGBA1C in the last 72 hours. CBG: Recent Labs  Lab 12/17/18 1945 12/18/18 0010 12/18/18 0354 12/18/18 0725 12/18/18 1204  GLUCAP 96 93 95 93 96   Lipid Profile: No results for input(s): CHOL, HDL, LDLCALC, TRIG, CHOLHDL, LDLDIRECT in the last 72 hours. Thyroid Function Tests: No results for input(s): TSH, T4TOTAL, FREET4, T3FREE, THYROIDAB in the last 72 hours. Anemia Panel: No results for input(s): VITAMINB12, FOLATE, FERRITIN, TIBC, IRON, RETICCTPCT in the last 72 hours. Sepsis Labs: Recent Labs  Lab 12/14/18 0753 12/14/18 1130 12/14/18 1200  PROCALCITON  --  7.07  --   LATICACIDVEN 3.0*  --  1.9    Recent Results (from the past 240 hour(s))  MRSA PCR Screening     Status: None   Collection Time: 12/14/18  6:09 AM   Specimen: Nasal Mucosa; Nasopharyngeal  Result Value Ref Range Status   MRSA by PCR NEGATIVE NEGATIVE Final    Comment:        The GeneXpert MRSA Assay (FDA approved for NASAL specimens only), is one component of a comprehensive MRSA colonization surveillance program. It is not intended to diagnose MRSA infection nor to guide or monitor treatment for MRSA infections. Performed at [facility], [place] [address] [place], [place] [address]   Culture, blood (routine x 2)  Status: None (Preliminary result)   Collection Time: 12/14/18  8:20 AM   Specimen: BLOOD RIGHT HAND  Result Value Ref Range Status    Specimen Description BLOOD RIGHT HAND  Final   Special Requests   Final    BOTTLES DRAWN AEROBIC ONLY Blood Culture results may not be optimal due to an inadequate volume of blood received in culture bottles   Culture   Final    NO GROWTH 3 DAYS Performed at [facility], [place] [address], [place], [place] [address]    Report Status PENDING  Incomplete  Culture, blood (routine x 2)     Status: None (Preliminary result)   Collection Time: 12/14/18  8:34 AM   Specimen: BLOOD LEFT HAND  Result Value Ref Range Status   Specimen Description BLOOD LEFT HAND  Final   Special Requests   Final    BOTTLES DRAWN AEROBIC ONLY Blood Culture results may not be optimal due to an inadequate volume of blood received in culture bottles   Culture   Final    NO GROWTH 3 DAYS Performed at [facility], [place] [address], , [place] [address]    Report Status PENDING  Incomplete  Culture, respiratory     Status: None   Collection Time: 12/14/18  8:55 AM   Specimen: Tracheal Aspirate; Respiratory  Result Value Ref Range Status   Specimen Description TRACHEAL ASPIRATE  Final   Special Requests NONE  Final   Gram Stain   Final    NO WBC SEEN RARE GRAM NEGATIVE RODS Performed at [facility], [place] [address] [place], [place] [address]    Culture   Final    FEW PROTEUS MIRABILIS FEW PSEUDOMONAS AERUGINOSA FEW PROVIDENCIA STUARTII    Report Status 12/16/2018 FINAL  Final   Organism ID, Bacteria PROTEUS MIRABILIS  Final   Organism ID, Bacteria PSEUDOMONAS AERUGINOSA  Final   Organism ID, Bacteria PROVIDENCIA STUARTII  Final      Susceptibility   Pseudomonas aeruginosa - MIC*    CEFTAZIDIME 4 SENSITIVE Sensitive     CIPROFLOXACIN >=4 RESISTANT Resistant     GENTAMICIN >=16 RESISTANT Resistant     IMIPENEM >=16 RESISTANT Resistant     PIP/TAZO 32 SENSITIVE Sensitive     CEFEPIME 16 INTERMEDIATE Intermediate     * FEW PSEUDOMONAS AERUGINOSA   Proteus mirabilis - MIC*     AMPICILLIN >=32 RESISTANT Resistant     CEFAZOLIN >=64 RESISTANT Resistant     CEFEPIME RESISTANT Resistant     CEFTAZIDIME RESISTANT Resistant     CEFTRIAXONE >=64 RESISTANT Resistant     CIPROFLOXACIN >=4 RESISTANT Resistant     GENTAMICIN >=16 RESISTANT Resistant     IMIPENEM 1 SENSITIVE Sensitive     TRIMETH/SULFA <=20 SENSITIVE Sensitive     AMPICILLIN/SULBACTAM >=32 RESISTANT Resistant     PIP/TAZO <=4 SENSITIVE Sensitive     * FEW PROTEUS MIRABILIS   Providencia stuartii - MIC*    AMPICILLIN >=32 RESISTANT Resistant     CEFAZOLIN >=64 RESISTANT Resistant     CEFEPIME <=1 SENSITIVE Sensitive     CEFTAZIDIME <=1 SENSITIVE Sensitive     CEFTRIAXONE <=1 SENSITIVE Sensitive     CIPROFLOXACIN >=4 RESISTANT Resistant     GENTAMICIN RESISTANT Resistant     IMIPENEM 2 SENSITIVE Sensitive     TRIMETH/SULFA <=20 SENSITIVE Sensitive     AMPICILLIN/SULBACTAM >=32 RESISTANT Resistant     PIP/TAZO <=4 SENSITIVE Sensitive     *  FEW PROVIDENCIA STUARTII      Radiology Studies: Dg Abd 1 View  Result Date: 12/17/2018 CLINICAL DATA:  Abdominal tenderness EXAM: ABDOMEN - 1 VIEW COMPARISON:  CT abdomen and pelvis December 14, 2018 FINDINGS: Gastrostomy catheter is positioned in the stomach. There are several loops of borderline dilated small bowel. No air-fluid levels. No free air. There is atelectatic change in the left lung base. Lung bases otherwise are clear. IMPRESSION: Loops of borderline dilated small bowel. Suspect a degree of ileus or enteritis. Bowel obstruction not felt to be likely. No free air. Gastrostomy catheter positioned in region of stomach. Left lower lobe atelectatic change. Electronically Signed   By: Lowella Grip III M.D.   On: 12/17/2018 13:23    Scheduled Meds: . chlorhexidine gluconate (MEDLINE KIT)  15 mL Mouth Rinse BID  . Chlorhexidine Gluconate Cloth  6 each Topical Daily  . folic acid  1 mg Intravenous Daily  . levETIRAcetam  1,000 mg Per Tube BID  . mouth  rinse  15 mL Mouth Rinse 10 times per day  . midodrine  10 mg Per Tube TID WC  . nutrition supplement (JUVEN)  1 packet Per Tube BID BM  . pantoprazole (PROTONIX) IV  40 mg Intravenous Q24H  . PHENObarbital  60 mg Per Tube BID  . sennosides  5 mL Per Tube BID   Continuous Infusions: . feeding supplement (VITAL AF 1.2 CAL) 1,000 mL (12/18/18 0531)  . piperacillin-tazobactam (ZOSYN)  IV 3.375 g (12/18/18 1211)     LOS: 4 days   Time spent: 3 minutes   Darliss Cheney, MD [facility]  12/18/2018, 2:24 PM   To contact the attending provider between 7A-7P or the covering provider during after hours 7P-7A, please log into the web site [email or web address] and use password [email or web address].

## 2018-12-19 ENCOUNTER — Inpatient Hospital Stay (HOSPITAL_COMMUNITY): Payer: Medicare Other

## 2018-12-19 DIAGNOSIS — J9611 Chronic respiratory failure with hypoxia: Secondary | ICD-10-CM

## 2018-12-19 LAB — CULTURE, BLOOD (ROUTINE X 2)
Culture: NO GROWTH
Culture: NO GROWTH

## 2018-12-19 LAB — CBC
HCT: 29.6 % — ABNORMAL LOW (ref 36.0–46.0)
Hemoglobin: 9.2 g/dL — ABNORMAL LOW (ref 12.0–15.0)
MCH: 28.1 pg (ref 26.0–34.0)
MCHC: 31.1 g/dL (ref 30.0–36.0)
MCV: 90.5 fL (ref 80.0–100.0)
Platelets: 358 10*3/uL (ref 150–400)
RBC: 3.27 MIL/uL — ABNORMAL LOW (ref 3.87–5.11)
RDW: 19 % — ABNORMAL HIGH (ref 11.5–15.5)
WBC: 6.7 10*3/uL (ref 4.0–10.5)
nRBC: 0 % (ref 0.0–0.2)

## 2018-12-19 LAB — GLUCOSE, CAPILLARY
Glucose-Capillary: 78 mg/dL (ref 70–99)
Glucose-Capillary: 81 mg/dL (ref 70–99)
Glucose-Capillary: 84 mg/dL (ref 70–99)
Glucose-Capillary: 91 mg/dL (ref 70–99)
Glucose-Capillary: 94 mg/dL (ref 70–99)
Glucose-Capillary: 95 mg/dL (ref 70–99)
Glucose-Capillary: 97 mg/dL (ref 70–99)

## 2018-12-19 LAB — BASIC METABOLIC PANEL
Anion gap: 8 (ref 5–15)
BUN: 11 mg/dL (ref 6–20)
CO2: 23 mmol/L (ref 22–32)
Calcium: 7.5 mg/dL — ABNORMAL LOW (ref 8.9–10.3)
Chloride: 106 mmol/L (ref 98–111)
Creatinine, Ser: <0.3 mg/dL — ABNORMAL LOW (ref 0.44–1.00)
Glucose, Bld: 92 mg/dL (ref 70–99)
Potassium: 3.6 mmol/L (ref 3.5–5.1)
Sodium: 137 mmol/L (ref 135–145)

## 2018-12-19 LAB — SARS CORONAVIRUS 2 (TAT 6-24 HRS): SARS Coronavirus 2: NEGATIVE

## 2018-12-19 MED ORDER — SODIUM CHLORIDE 0.9 % IV SOLN
1.0000 g | Freq: Three times a day (TID) | INTRAVENOUS | Status: DC
  Administered 2018-12-19 – 2018-12-20 (×4): 1 g via INTRAVENOUS
  Filled 2018-12-19 (×6): qty 1

## 2018-12-19 MED ORDER — CHLORHEXIDINE GLUCONATE 0.12 % MT SOLN
OROMUCOSAL | Status: AC
  Filled 2018-12-19: qty 15

## 2018-12-19 MED ORDER — SODIUM CHLORIDE 0.9 % IV SOLN: INTRAVENOUS | Status: DC | PRN

## 2018-12-19 NOTE — Progress Notes (Signed)
PROGRESS NOTE    Archana Eckman  OAC:[id number] DOB: [birth year] DOA: 12/14/2018 PCP: Sherrlyn Hock, MD   Brief Narrative:  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 She was brought to [facility] in [place] where there was no further evidence of bleeding, however pt had evidence of UTI, PNA, lactic acidosis of 6.1, leukocytosis of 17.5, hemoglobin of 6.7. She was given 3 L of IV fluid, Levaquin and 2 unit PRBC transfusion and became bradycardic and coded requiring atropine, CPR and cardioversion. Per notes she had three minutes of chest compression, epix1 and defibrillated and then pt converted back to sinus tachycardia.  CXR showed LUL and RML infiltrates, but noted to have improved compared to study done 11/29/18.  She was started on Levophed and transferred to [facility].     Assessment & Plan:   Active Problems:   Sepsis ([place])   Anoxic brain injury ([place])   Ventilator dependence ([place])   Cardiac arrest ([place])   Prolonged Q-T interval on ECG   Hemorrhagic shock (HCC)   Septic shock (HCC)   Pneumonia of left lung due to infectious organism   Sacral decubitus ulcer, stage IV (HCC)   Chronic respiratory failure (HCC)   Shock hemorrhagic vs septic (2/2 below)/?  Gram-positive/staph bacteremia/ HCAP: -does have 1 positive blood cx with staph but not otherwise specified.  -also has stigmata of retroperitoneal bleed on ct. IR feels collection is small and the indolent nature is not likely arterial. If cont to be issue rec repeat ct but no intervention or cta recommended -Off vasopressors since 10/23.  Respiratory culture growing  Proteus mirabilis, Pseudomonas, providentia stuartti.  Antibiotics have been broadened to meropenem.  She is afebrile.  No leukocytosis.  Continue current antibiotics.  Blood pressure stable.  Continue midodrine.  Chronic hypoxic respiratory failure/trach and vent dependent: PCCM managing.  Appreciate their help.  GNR UTI   -Cx from OSH.  -Continue abx  Acute on chronic anemia -baseline Hgb in care everywhere appears to be ~8-10 -admitted in July with Hgb drop and found to have retroperitoneal hematoma (? If fluid in ct now old or acute on chronic).  Hemoglobin dropped to 6.9 on 12/14/2018.  Received transfusion.  Hemoglobin has remained stable around 8 since then.  Large sacral and back decubitus ulcers P: -Wound care following  Anoxic brain injury with chronic respiratory failure and vent dependence -unclear what her neurologic baseline is, notes from OSH note spontaneously opening eyes and anxious and she seems to be doing the same here.  Full vent support by PCCM.  H/o sz:  - home meds: Phenobarb, Keppra  Prolonged Qtc -Resolved.  EKG on 12/17/2018 shows Qtc 449  Thrombocytopenia:.  Likely due to sepsis.  Now resolved and back to normal.  Abdominal tenderness/?  Ileus.  She was not tender on abdominal exam today.  Will repeat abdominal x-ray.  DVT prophylaxis: We will start on Lovenox Code Status: Full code Family Communication: None present at bedside.  Disposition Plan: TBD  Estimated body mass index is 25.37 kg/m as calculated from the following:   Height as of this encounter: 5' 6"  (1.676 m).  Weight as of this encounter: 71.3 kg.  Pressure Injury 12/14/18 Sacrum Mid Stage IV - Full thickness tissue loss with exposed bone, tendon or muscle. (Active)  12/14/18   Location: Sacrum  Location Orientation: Mid  Staging: Stage IV - Full thickness tissue loss with exposed bone, tendon or muscle.  Wound Description (Comments):   Present on Admission: Yes      Pressure Injury 12/14/18 Heel Left Deep Tissue Injury - Purple or maroon localized area of discolored intact skin or blood-filled blister due to damage of underlying soft tissue from pressure and/or shear. (Active)  12/14/18   Location: Heel  Location Orientation: Left  Staging: Deep Tissue Injury - Purple or maroon localized area of discolored intact skin or blood-filled blister due to damage of underlying soft tissue from pressure and/or shear.  Wound Description (Comments):   Present on Admission: Yes     Pressure Injury 12/14/18 Ankle Left Unstageable - Full thickness tissue loss in which the base of the ulcer is covered by slough (yellow, tan, gray, green or brown) and/or eschar (tan, brown or black) in the wound bed. (Active)  12/14/18   Location: Ankle  Location Orientation: Left  Staging: Unstageable - Full thickness tissue loss in which the base of the ulcer is covered by slough (yellow, tan, gray, green or brown) and/or eschar (tan, brown or black) in the wound bed.  Wound Description (Comments):   Present on Admission: Yes     Nutritional status:  Nutrition Problem: Increased nutrient needs Etiology: chronic illness, wound healing   Signs/Symptoms: estimated needs   Interventions: Refer to RD note for recommendations    Consultants:   PCCM  Procedures:   None  Antimicrobials:  Levaquin 10/20  Vanc 10/21> 10/23 Cefepime 10/21> 10/23 Mero 10/23>>   Subjective: Seen and examined.  She is at her baseline.  Nonverbal.  Grimaces.  On vent.  Objective: Vitals:   12/19/18 0700 12/19/18 0728 12/19/18 0752 12/19/18 0800  BP: 112/64  112/66 91/65  Pulse: 93  92 93  Resp: (!) 21  20 14   Temp:  97.8 F (36.6 C)    TempSrc:  Axillary    SpO2: 100%  100% 100%  Weight:      Height:        Intake/Output Summary (Last 24 hours) at 12/19/2018 0844 Last data filed at 12/19/2018 0600 Gross per 24 hour  Intake 1675.1 ml  Output 4025 ml  Net -2349.9 ml    Filed Weights   12/17/18 0500 12/18/18 0427 12/19/18 0450  Weight: 71.3 kg 71.8 kg 71.3 kg    Examination:  General exam: Appears calm and comfortable, on trach and vent Respiratory system: Diminished breath sounds at bases with rhonchi bilaterally. Respiratory effort normal. Cardiovascular system: S1 & S2 heard, RRR. No JVD, murmurs, rubs, gallops or clicks. No pedal edema.  +1 pitting edema bilateral upper extremities. Gastrointestinal system: Abdomen is nondistended, soft and nontender. No organomegaly or masses felt. Normal bowel sounds heard.  PEG tube in place. Central nervous system: Contractures in the lower extremities.  Nonverbal.  Grimaces to touch. Extremities: Symmetric 5 x 5 power. Skin: Sacral ulcer.  Dressing in place. Psychiatry: Unable to assess due to being nonverbal.  Data Reviewed: I have personally reviewed following labs and imaging studies  CBC: Recent Labs  Lab 12/16/18 0509 12/17/18 0411 12/17/18 1548 12/18/18 0050 12/19/18 0323  WBC 5.0 3.3* 5.7 4.9 6.7  HGB 7.9* 9.5* 8.7* 7.9* 9.2*  HCT 25.4* 30.7* 27.3* 25.5* 29.6*  MCV 89.8 89.2 88.9 89.5 90.5  PLT 341 93* 371 299 817   Basic Metabolic Panel: Recent Labs  Lab 12/14/18 1111 12/15/18 0508  12/15/18 1647 12/15/18 2049 12/16/18 0509 12/16/18 1653 12/17/18 0411 12/18/18 0050 12/18/18 1013 12/19/18 0323  NA 140 144   < >  --   --  141  --  142 138 138 137  K 4.7 <2.0*   < >  --   --  2.5*  --  3.9 3.0* 4.1 3.6  CL 106 115*   < >  --   --  115*  --  116* 107 106 106  CO2 24 22   < >  --   --  20*  --  20* 24 23 23   GLUCOSE 91 93   < >  --   --  96  --  90 96 88 92  BUN 28* 17   < >  --   --  17  --  14 15 9 11   CREATININE 0.42* <0.30*   < >  --   --  <0.30*  --  <0.30* <0.30* <0.30* <0.30*  CALCIUM 8.1* 8.3*   < >  --   --  8.5*  --  8.5* 7.7* 7.5* 7.5*  MG 2.1 1.8  --   --  2.0  --   --   --  1.3*  --   --   PHOS  --   --   --  2.0*  --  1.9* 1.7*  --  <1.0* 3.2  --    < > = values in  this interval not displayed.   GFR: CrCl cannot be calculated (This lab value cannot be used to calculate CrCl because it is not a number: <0.30). Liver Function Tests: Recent Labs  Lab 12/18/18 0050  AST 14*  ALT 22  ALKPHOS 248*  BILITOT 0.2*  PROT 5.3*  ALBUMIN 1.3*   No results for input(s): LIPASE, AMYLASE in the last 168 hours. No results for input(s): AMMONIA in the last 168 hours. Coagulation Profile: Recent Labs  Lab 12/14/18 1130 12/18/18 0050  INR 1.5* 1.1   Cardiac Enzymes: No results for input(s): CKTOTAL, CKMB, CKMBINDEX, TROPONINI in the last 168 hours. BNP (last 3 results) No results for input(s): PROBNP in the last 8760 hours. HbA1C: No results for input(s): HGBA1C in the last 72 hours. CBG: Recent Labs  Lab 12/18/18 1708 12/18/18 2008 12/18/18 2359 12/19/18 0409 12/19/18 0724  GLUCAP 95 94 91 81 97   Lipid Profile: No results for input(s): CHOL, HDL, LDLCALC, TRIG, CHOLHDL, LDLDIRECT in the last 72 hours. Thyroid Function Tests: No results for input(s): TSH, T4TOTAL, FREET4, T3FREE, THYROIDAB in the last 72 hours. Anemia Panel: No results for input(s): VITAMINB12, FOLATE, FERRITIN, TIBC, IRON, RETICCTPCT in the last 72 hours. Sepsis Labs: Recent Labs  Lab 12/14/18 0753 12/14/18 1130 12/14/18 1200  PROCALCITON  --  7.07  --   LATICACIDVEN 3.0*  --  1.9    Recent Results (from the past 240 hour(s))  MRSA PCR Screening     Status: None   Collection Time: 12/14/18  6:09 AM   Specimen: Nasal Mucosa; Nasopharyngeal  Result Value Ref Range Status   MRSA by PCR NEGATIVE NEGATIVE Final    Comment:        The GeneXpert MRSA Assay (FDA approved for NASAL specimens only), is one component of a comprehensive MRSA colonization surveillance program. It is not intended to diagnose  MRSA infection nor to guide or monitor treatment for MRSA infections. Performed at [facility], [place] [address] [place], [place] [address]   Culture, blood  (routine x 2)     Status: None   Collection Time: 12/14/18  8:20 AM   Specimen: BLOOD RIGHT HAND  Result Value Ref Range Status   Specimen Description BLOOD RIGHT HAND  Final   Special Requests   Final    BOTTLES DRAWN AEROBIC ONLY Blood Culture results may not be optimal due to an inadequate volume of blood received in culture bottles   Culture   Final    NO GROWTH 5 DAYS Performed at [facility], [place] [address], [place], [place] [address]    Report Status 12/19/2018 FINAL  Final  Culture, blood (routine x 2)     Status: None   Collection Time: 12/14/18  8:34 AM   Specimen: BLOOD LEFT HAND  Result Value Ref Range Status   Specimen Description BLOOD LEFT HAND  Final   Special Requests   Final    BOTTLES DRAWN AEROBIC ONLY Blood Culture results may not be optimal due to an inadequate volume of blood received in culture bottles   Culture   Final    NO GROWTH 5 DAYS Performed at [facility], [place] [address], [place], [place] [address]    Report Status 12/19/2018 FINAL  Final  Culture, respiratory     Status: None   Collection Time: 12/14/18  8:55 AM   Specimen: Tracheal Aspirate; Respiratory  Result Value Ref Range Status   Specimen Description TRACHEAL ASPIRATE  Final   Special Requests NONE  Final   Gram Stain   Final    NO WBC SEEN RARE GRAM NEGATIVE RODS Performed at [facility], [place] [address], [place], [place] [address]    Culture   Final    FEW PROTEUS MIRABILIS FEW PSEUDOMONAS AERUGINOSA FEW PROVIDENCIA STUARTII    Report Status 12/16/2018 FINAL  Final   Organism ID, Bacteria PROTEUS MIRABILIS  Final   Organism ID, Bacteria PSEUDOMONAS AERUGINOSA  Final   Organism ID, Bacteria PROVIDENCIA STUARTII  Final      Susceptibility   Pseudomonas aeruginosa - MIC*    CEFTAZIDIME 4 SENSITIVE Sensitive     CIPROFLOXACIN >=4 RESISTANT Resistant     GENTAMICIN >=16 RESISTANT Resistant     IMIPENEM >=16 RESISTANT Resistant     PIP/TAZO 32 SENSITIVE  Sensitive     CEFEPIME 16 INTERMEDIATE Intermediate     * FEW PSEUDOMONAS AERUGINOSA   Proteus mirabilis - MIC*    AMPICILLIN >=32 RESISTANT Resistant     CEFAZOLIN >=64 RESISTANT Resistant     CEFEPIME RESISTANT Resistant     CEFTAZIDIME RESISTANT Resistant     CEFTRIAXONE >=64 RESISTANT Resistant     CIPROFLOXACIN >=4 RESISTANT Resistant     GENTAMICIN >=16 RESISTANT Resistant     IMIPENEM 1 SENSITIVE Sensitive     TRIMETH/SULFA <=20 SENSITIVE Sensitive     AMPICILLIN/SULBACTAM >=32 RESISTANT Resistant     PIP/TAZO <=4 SENSITIVE Sensitive     * FEW PROTEUS MIRABILIS   Providencia stuartii - MIC*    AMPICILLIN >=32 RESISTANT Resistant     CEFAZOLIN >=64 RESISTANT Resistant     CEFEPIME <=1 SENSITIVE Sensitive     CEFTAZIDIME <=1 SENSITIVE Sensitive     CEFTRIAXONE <=1 SENSITIVE Sensitive     CIPROFLOXACIN >=4 RESISTANT Resistant     GENTAMICIN RESISTANT Resistant  IMIPENEM 2 SENSITIVE Sensitive     TRIMETH/SULFA <=20 SENSITIVE Sensitive     AMPICILLIN/SULBACTAM >=32 RESISTANT Resistant     PIP/TAZO <=4 SENSITIVE Sensitive     * FEW PROVIDENCIA STUARTII      Radiology Studies: Dg Abd 1 View  Result Date: 12/17/2018 CLINICAL DATA:  Abdominal tenderness EXAM: ABDOMEN - 1 VIEW COMPARISON:  CT abdomen and pelvis December 14, 2018 FINDINGS: Gastrostomy catheter is positioned in the stomach. There are several loops of borderline dilated small bowel. No air-fluid levels. No free air. There is atelectatic change in the left lung base. Lung bases otherwise are clear. IMPRESSION: Loops of borderline dilated small bowel. Suspect a degree of ileus or enteritis. Bowel obstruction not felt to be likely. No free air. Gastrostomy catheter positioned in region of stomach. Left lower lobe atelectatic change. Electronically Signed   By: Lowella Grip III M.D.   On: 12/17/2018 13:23    Scheduled Meds: . chlorhexidine gluconate (MEDLINE KIT)  15 mL Mouth Rinse BID  . Chlorhexidine Gluconate  Cloth  6 each Topical Daily  . enoxaparin (LOVENOX) injection  40 mg Subcutaneous Q24H  . folic acid  1 mg Intravenous Daily  . levETIRAcetam  1,000 mg Per Tube BID  . mouth rinse  15 mL Mouth Rinse 10 times per day  . midodrine  10 mg Per Tube TID WC  . nutrition supplement (JUVEN)  1 packet Per Tube BID BM  . pantoprazole (PROTONIX) IV  40 mg Intravenous Q24H  . PHENObarbital  60 mg Per Tube BID  . sennosides  5 mL Per Tube BID   Continuous Infusions: . feeding supplement (VITAL AF 1.2 CAL) 1,000 mL (12/19/18 0040)  . piperacillin-tazobactam (ZOSYN)  IV 3.375 g (12/19/18 0411)     LOS: 5 days   Time spent: 29 minutes   Darliss Cheney, MD [facility]  12/19/2018, 8:44 AM   To contact the attending provider between 7A-7P or the covering provider during after hours 7P-7A, please log into the web site [email or web address] and use password [email or web address].

## 2018-12-19 NOTE — Progress Notes (Signed)
Mother updated on phone to patient condition and possible return to SNF tomorrow.

## 2018-12-19 NOTE — Progress Notes (Signed)
NAME:  Ashlee Wagner, MRN:  [id number], DOB:  [birth year], LOS: 5 ADMISSION DATE:  12/14/2018, CONSULTATION DATE:  12/19/18 CHIEF COMPLAINT:  Sepsis  Brief History   [name] with PMH or TBI requiring trach and ventilator dependence who has a large sacral decubitus ulcer, per paper records and report, this wound was apparently debrided at her nursing home and staff found her in with a large volume of blood in the bed which they thought came from the wound. She was brought to [facility] in [place] where there was no further evidence of bleeding, however pt had evidence of UTI, PNA, lactic acidosis and became bradycardic and coded requiring atropine, CPR and cardioversion.  There were no regional ICU beds available, so the patient was transferred to [facility].  History of present illness   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  All history is taken from paper chart and care everywhere in Epic as patient is unresponsive.   On arrival to the ED, no wound bleeding was appreciated, but foley bulb noted to be out of the urethra with hematuria.   ED work-up notable for lactic acidosis of 6.1, leukocytosis of 17.5k, UTI and Hgb of 6.7.   Pt was given 3L IVF, levaquin and 2 units PRBC's and apparently became bradycardic and was given atropine.  When BP dropped, CPR was initiated.  Per notes she had three minutes of chest compression, epix1 and defibrillated and then pt converted back to sinus tachycardia.  CXR showed LUL and RML infiltrates, but noted to have improved compared to study done 11/29/18.  She was started on  Levophed and transferred to [facility].     On review of care everywhere pt has been admitted to [facility] earlier this year and found to have retroperitoneal hematoma while anti-coagulated for DVT.   On arrival, pt is tachycardic to 140's, hypotensive and minimally responsive.  Past Medical History   Past Medical History:  Diagnosis Date  . Anoxic brain injury (HCC)   . Chronic respiratory failure with hypoxia (HCC)    Trach/vent dependent   . DVT (deep venous thrombosis) (HCC)    recurrent: off anticoagulation following RP bleed 08/2018.  Marland Kitchen Persistent vegetative state (HCC)   . Pressure ulcer    decub, back.   . Tracheostomy status (HCC)   . Traumatic brain injury [facility])      Significant Hospital Events   10/21 Admit to PCCM 10/24: stable for transfer to intermediate care or floor.   Consults:    Procedures:  10/22: G Tube replaced in IR  Significant Diagnostic Tests:  10/21 CT abd/pelvis>>1. Large left-sided retroperitoneal fluid collection, presumably reflective of a resolving retroperitoneal hemorrhage. 2. Bibasilar areas of atelectasis and consolidation in the dependent portions of the lower lobes of the lungs bilaterally (left greater than right), with trace left pleural effusion lying dependently. 3. Multiple borderline enlarged and minimally enlarged mesenteric, retroperitoneal and retrocrural lymph nodes, as above, nonspecific, but favored to be reactive. 4. Multiple deep decubitus ulcers, most significant in the sacral region where there is extension to the underlying bone, and osseous changes of chronic osteomyelitis. 5. Hepatic  steatosis. 6. 2 mm nonobstructive calculus in the upper pole collecting system of left kidney. No ureteral stones or findings of urinary tract obstruction are noted at this time. 7. Additional incidental findings, as above. 10/21 CT head>>1. No acute intracranial abnormalities. 2. Severe age advanced cerebral and cerebellar  atrophy with extensive chronic microvascular ischemic changes throughout the cerebral white matter, as above. 10/21 L UE venous doppler: No evidence of deep vein thrombosis in the upper extremity. However, unable to visualize the basilic, subclavian, IJ. No evidence of superficial vein thrombosis in the upper extremity. However, unable to visualize the basilic, subclavian, IJ. No evidence of thrombosis in the . However, unable to visualize the basilic, subclavian, IJ. This was a limited study.  Micro Data:  10/21 blood osh: + staph, NOS 10/21 BC x2>> ngtd 10/21 UC OSH>> gnr 10/21 resp cx: Proteus, Pseudomonas, Providencia stuartii  Antimicrobials:  Levaquin 10/20  Vanc 10/21> 10/23 Cefepime 10/21> 10/23 Mero 10/23>>  Interim history/subjective:  10/26: no acute events overnight. [facility] pt clinically improved when we were treating the ESBL proteus in her urine with merrem. Considering the polymicrobial resp cx colonization and no indication for treatment with standard vent settings and clinically improved without tx for this. Will change abx back to merrem.   Objective   Blood pressure 91/65, pulse 93, temperature 97.8 F (36.6 C), temperature source Axillary, resp. rate 14, height 5\' 6"  (1.676 m), weight 71.3 kg, SpO2 100 %.    Vent Mode: PRVC FiO2 (%):  [40 %] 40 % Set Rate:  [20 bmp] 20 bmp Vt Set:  [450 mL] 450 mL PEEP:  [5 cmH20] 5 cmH20 Plateau Pressure:  [18 cmH20-24 cmH20] 19 cmH20   Intake/Output Summary (Last 24 hours) at 12/19/2018 0838 Last data filed at 12/19/2018 0600 Gross per 24 hour  Intake 1675.1 ml  Output 4025 ml  Net -2349.9 ml   Filed Weights   12/17/18 0500 12/18/18 0427 12/19/18 0450  Weight: 71.3 kg 71.8 kg 71.3 kg     General:  Female, trach on ventilator, awake not tracking HEENT: [place]/AT, mucous membranes most.  Neuro:opens eyes spontaneously, does not follow commands, contracted in extremities.  CV: SR  on telemetry. Warm and well perfused. + edema in extremities. PULM: clear bilaterally minimal secretions via trach GI: soft, + tender, non distended Extremities: L UE with >> edema than other extremities Skin: large sacral decubitus ulcer and L upper back pressure wound   Resolved Hospital Problem list   Lactic acidosis shock  Assessment & Plan:   Shock hemorrhagic vs septic (2/2 below), resolved -does have 1 positive blood cx with staph but considered contaminant  -uti 2/2 ESBL -also has stigmata of retroperitoneal bleed on ct. IR feels collection is small and the indolent nature is not likely arterial. If cont to be issue rec repeat ct but no intervention or cta recommended -Off vasopressors since 10/23 P: -Continue home midodrine  GP/staph bacteremia: ? contaminate -Follow blood and urine cultures from OSH: OSH records with  1+ blood cx with staph -Repeat Blood Cx here Neg  ESBL proteus UTI   -change back to merrem -Continue abx  L PNA: Proteus, Pseudomonas, Providencia stuartii  -improved and likely colonization  Acute on chronic anemia -baseline Hgb in care everywhere appears to be ~8-10 -admitted in July with Hgb drop and found to have retroperitoneal hematoma (? If fluid in ct now old or acute on chronic) P: -Hgb stable this am without  indication for transfusion. Continue to monitor    Large sacral and back decubitus ulcers P: -Wound care following  Anoxic brain injury with chronic respiratory failure and vent dependence -unclear what her neurologic baseline is, notes from OSH note spontaneously opening eyes and anxious P: -stable vent settings  H/o sz:  - home meds: Phenobarb, Keppra  Prolonged Qtc -resolved   Thrombocytopenia -resolved   Abdominal tenderness P: -resolved  Best practice:  Diet: NPO,  Tf ongoing Pain/Anxiety/Delirium protocol (if indicated): Fentanyl VAP protocol (if indicated): yes DVT prophylaxis: SCD's GI prophylaxis:  protonix Glucose control: SSI Mobility: bed-bound Code Status: full Family Communication: phone conversation with mother 10/26 Disposition: intermediate vs floor. Will see again Thursday 10/29. Please call if needed sooner  Labs   CBC: Recent Labs  Lab 12/16/18 0509 12/17/18 0411 12/17/18 1548 12/18/18 0050 12/19/18 0323  WBC 5.0 3.3* 5.7 4.9 6.7  HGB 7.9* 9.5* 8.7* 7.9* 9.2*  HCT 25.4* 30.7* 27.3* 25.5* 29.6*  MCV 89.8 89.2 88.9 89.5 90.5  PLT 341 93* 371 299 358    Basic Metabolic Panel: Recent Labs  Lab 12/14/18 1111 12/15/18 0508  12/15/18 1647 12/15/18 2049 12/16/18 0509 12/16/18 1653 12/17/18 0411 12/18/18 0050 12/18/18 1013 12/19/18 0323  NA 140 144   < >  --   --  141  --  142 138 138 137  K 4.7 <2.0*   < >  --   --  2.5*  --  3.9 3.0* 4.1 3.6  CL 106 115*   < >  --   --  115*  --  116* 107 106 106  CO2 24 22   < >  --   --  20*  --  20* 24 23 23   GLUCOSE 91 93   < >  --   --  96  --  90 96 88 92  BUN 28* 17   < >  --   --  17  --  14 15 9 11   CREATININE 0.42* <0.30*   < >  --   --  <0.30*  --  <0.30* <0.30* <0.30* <0.30*  CALCIUM 8.1* 8.3*   < >  --   --  8.5*  --  8.5* 7.7* 7.5* 7.5*  MG 2.1 1.8  --   --  2.0  --   --   --  1.3*  --   --   PHOS  --   --   --  2.0*  --  1.9* 1.7*  --  <1.0* 3.2  --    < > = values in this interval not displayed.   GFR: CrCl cannot be calculated (This lab value cannot be used to calculate CrCl because it is not a number: <0.30). Recent Labs  Lab 12/14/18 0753  12/14/18 1130 12/14/18 1200  12/17/18 0411 12/17/18 1548 12/18/18 0050 12/19/18 0323  PROCALCITON  --   --  7.07  --   --   --   --   --   --   WBC  --    < >  --   --    < > 3.3* 5.7 4.9 6.7  LATICACIDVEN 3.0*  --   --  1.9  --   --   --   --   --    < > = values in this interval not displayed.    Liver Function Tests: Recent Labs  Lab 12/18/18 0050  AST 14*  ALT 22  ALKPHOS  248*  BILITOT 0.2*  PROT 5.3*  ALBUMIN 1.3*   No results for  input(s): LIPASE, AMYLASE in the last 168 hours. No results for input(s): AMMONIA in the last 168 hours.  ABG    Component Value Date/Time   PHART 7.437 12/14/2018 0625   PCO2ART 35.1 12/14/2018 0625   PO2ART 144.0 (H) 12/14/2018 0625   HCO3 23.7 12/14/2018 0625   TCO2 25 12/14/2018 0625   O2SAT 99.0 12/14/2018 0625     Coagulation Profile: Recent Labs  Lab 12/14/18 1130 12/18/18 0050  INR 1.5* 1.1    Cardiac Enzymes: No results for input(s): CKTOTAL, CKMB, CKMBINDEX, TROPONINI in the last 168 hours.  HbA1C: Hgb A1c MFr Bld  Date/Time Value Ref Range Status  12/14/2018 07:54 PM 4.9 4.8 - 5.6 % Final    Comment:    (NOTE) Pre diabetes:          5.7%-6.4% Diabetes:              >6.4% Glycemic control for   <7.0% adults with diabetes     CBG: Recent Labs  Lab 12/18/18 1708 12/18/18 2008 12/18/18 2359 12/19/18 0409 12/19/18 0724  GLUCAP 95 94 91 81 97   [id number]    Audria Nine DO After hours pager: [phone]  [facility] 12/19/2018, 8:38 AM

## 2018-12-19 NOTE — Progress Notes (Addendum)
3pm: CSW spoke with Merry Proud at [place] to arrange for transportation to the facility tomorrow at [place]: CSW received return call from [place] at [place] stating she needs updated clinical information and a COVID test performed before this patient can return tomorrow. CSW notified Shirlean Mylar, RN that a new COVID test would need to be completed prior to discharge.   CSW sent updated clinical information to [place].  12pm: CSW was informed by RN that the patient has returned to baseline and can return to her vent SNF in [place]. The patient came from [place]. CSW attempted to reach [place] in admissions at [facility] without success, a voicemail was left requesting a return.  Madilyn Fireman, MSW, LCSW-A Transitions of Care  Clinical Social Worker  [facility] Emergency Departments  Medical ICU [phone]

## 2018-12-19 NOTE — Progress Notes (Signed)
RT note: patient chronic trach ventilator.  No SBT at this time.  Will continue to monitor.

## 2018-12-20 LAB — BASIC METABOLIC PANEL
Anion gap: 8 (ref 5–15)
BUN: 12 mg/dL (ref 6–20)
CO2: 24 mmol/L (ref 22–32)
Calcium: 8.2 mg/dL — ABNORMAL LOW (ref 8.9–10.3)
Chloride: 108 mmol/L (ref 98–111)
Creatinine, Ser: <0.3 mg/dL — ABNORMAL LOW (ref 0.44–1.00)
Glucose, Bld: 91 mg/dL (ref 70–99)
Potassium: 3.7 mmol/L (ref 3.5–5.1)
Sodium: 140 mmol/L (ref 135–145)

## 2018-12-20 LAB — CBC
HCT: 31.7 % — ABNORMAL LOW (ref 36.0–46.0)
Hemoglobin: 9.6 g/dL — ABNORMAL LOW (ref 12.0–15.0)
MCH: 27.8 pg (ref 26.0–34.0)
MCHC: 30.3 g/dL (ref 30.0–36.0)
MCV: 91.9 fL (ref 80.0–100.0)
Platelets: 394 10*3/uL (ref 150–400)
RBC: 3.45 MIL/uL — ABNORMAL LOW (ref 3.87–5.11)
RDW: 19.1 % — ABNORMAL HIGH (ref 11.5–15.5)
WBC: 6.8 10*3/uL (ref 4.0–10.5)
nRBC: 0 % (ref 0.0–0.2)

## 2018-12-20 LAB — MAGNESIUM: Magnesium: 1.5 mg/dL — ABNORMAL LOW (ref 1.7–2.4)

## 2018-12-20 LAB — GLUCOSE, CAPILLARY
Glucose-Capillary: 83 mg/dL (ref 70–99)
Glucose-Capillary: 85 mg/dL (ref 70–99)
Glucose-Capillary: 91 mg/dL (ref 70–99)

## 2018-12-20 MED ORDER — DEXTROSE 5 % IV SOLN: INTRAVENOUS | Status: DC

## 2018-12-20 MED ORDER — OXYCODONE HCL 5 MG PO TABS: 5.0000 mg | ORAL_TABLET | Freq: Four times a day (QID) | ORAL | 0 refills | Status: DC

## 2018-12-20 MED ORDER — OXYCODONE HCL 5 MG PO TABS: 5.0000 mg | ORAL_TABLET | Freq: Four times a day (QID) | ORAL | 0 refills | Status: AC

## 2018-12-20 MED ORDER — MEROPENEM IV (FOR PTA / DISCHARGE USE ONLY): 1.0000 g | Freq: Three times a day (TID) | INTRAVENOUS | 0 refills | Status: AC

## 2018-12-20 NOTE — TOC Transition Note (Signed)
Transition of Care [facility]) - CM/SW Discharge Note   Patient Details  Name: Ashlee Wagner MRN: [id number] Date of Birth: 22-Oct-1985  Transition of Care [facility]) CM/SW Contact:  Archie Endo, LCSW Phone Number: 12/20/2018, 10:21 AM   Clinical Narrative:    Patient will be discharged back to [facility] vent [facility]F in [place], [place] today via [place] at 12pm. Patient will go to room 104B. The number to call for report is [phone], option 2.  CSW spoke with patient's mother Haydee at [phone] to inform her of discharge plan, mother is agreeable and accepting.  Final next level of care: [place] Barriers to Discharge: No Barriers Identified   Patient Goals and CMS Choice        Discharge Placement              Patient chooses bed at: ([facility] - vent SNF) Patient to be transferred to facility by: [facility] Transport Name of family member notified: Lonzo Cloud, [phone] Patient and family notified of of transfer: 12/20/18  Discharge Plan and Services                                     Social Determinants of Health (SDOH) Interventions     Readmission Risk Interventions No flowsheet data found.

## 2018-12-20 NOTE — Discharge Instructions (Signed)
Sepsis, Self Care, Adult Sepsis is a serious illness that may require intensive care in the hospital. The following information explains what you need to know in order to manage your condition after you are discharged from the hospital. What are the risks? After being treated for sepsis and discharged from the hospital, you may be at a higher risk for certain problems. These problems may be physical or mental. Physical problems:  Weakness and tiredness.  Shortness of breath.  Pain in many areas of the body.  Difficulty walking.  Dry, itchy skin.  Lack of appetite. This may lead to weight loss.  Organ failure. Mental problems:  Difficulty sleeping.  Depression.  Confusion.  Anxiety and worry caused by having gone through a bad experience (post-traumatic stress disorder,PTSD).  Low self-esteem. Follow these instructions at home: Medicines  Take over-the-counter and prescription medicines only as told by your health care provider.  If you were prescribed an antibiotic, antiviral, or antifungal medicine, take it as told by your health care provider. Do not stop taking the medicine even if you start to feel better. Eating and drinking   Eat a healthy diet that includes plenty of vegetables, fruits, whole grains, low-fat dairy products, and lean protein. Ask your health care provider if you should avoid certain foods.  Drink enough fluid to keep your urine pale yellow. Alcohol use  Do not drink alcohol if: ? Your health care provider tells you not to drink. ? You are pregnant, may be pregnant, or are planning to become pregnant.  If you drink alcohol, limit how much you use to: ? 0-1 drink a day for women. ? 0-2 drinks a day for men.  Be aware of how much alcohol is in your drink. In the [place] one drink equals one 12 oz bottle of beer (355 mL), one 5 oz glass of wine (148 mL), or one 1 oz glass of hard liquor (44 mL). Activity  Rest and gradually return to your  normal activities. Ask your health care provider what activities are safe for you.  Avoid sitting for a long time without moving. Get up to take short walks every 1-2 hours. This is important to improve blood flow and breathing. Ask for help if you feel weak or unsteady.  Try to set small, achievable goals each week, such as dressing yourself, bathing, or walking up the stairs. It may take a while to rebuild your strength.  Try to exercise regularly if you feel healthy enough to do so. Ask your health care provider what exercises are safe for you. Preventing infection   Keep your vaccinations up to date. Get the flu shot every year.  Wash your hands often using soap and water. Use hand sanitizer if soap and water are not available.  Practice good hygiene. Keep cuts clean and covered until healed. Managing stress Talk with your health care provider or counselor about ways to reduce stress. He or she may suggest:  Meditation, muscle relaxation, and breathing exercises.  Talk therapy.  Spending time on hobbies and activities that you enjoy. General instructions  Get the right amount and quality of sleep. Most adults need 7-9 hours of sleep each night. To help with sleep: ? Keep your bedroom cool and dark. ? Do not eat a heavy meal within one hour of bedtime. ? Do not drink alcohol or caffeinated drinks before bed. ? Avoid screen time, such as television, computers, tablets, or cell phones before bed.  Do not use any products  that contain nicotine or tobacco, such as cigarettes, e-cigarettes, and chewing tobacco. If you need help quitting, ask your health care provider.  Talk to trusted family members and friends about your condition. Explain your symptoms to them, and let them know that you are working with a health care provider to treat your condition. This can provide you with one way to get support and guidance.  Keep all follow-up visits as told by your health care provider. This  is important. Questions to ask your health care provider:  What physical and emotional changes do I need to report?  Do I need to have someone with me all the time?  Is it safe for me to drive? Contact a health care provider if you:  Do not feel like you are getting better or regaining strength.  Have muscle or joint pain.  Frequently feel tired.  Are having trouble coping with your recovery.  Have nightmares, or trouble falling asleep or staying asleep.  Feel sad, down, or depressed more often than not, every day for more than 2 weeks.  Have difficulty concentrating.  Feel irritable or you cry for no reason. Get help right away if you:  Have difficulty breathing.  Have a rapid or skipping heartbeat.  Become confused or disoriented.  See, hear, or feel things that do not exist (hallucinations).  Have a high fever.  Have an infection that is getting worse or not getting better.  You have thoughts of hurting yourself or others. If you ever feel like you may hurt yourself or others, or have thoughts about taking your own life, get help right away. You can go to your nearest emergency department or call:  Your local emergency services (911 in the [place]  A suicide crisis helpline, such as the [place] at 417 131 3270. This is open 24 hours a day. Summary  Sepsis is a serious illness that may require intensive care in a hospital. You may experience long-term health effects after you are discharged from the hospital.  Try to set small, achievable goals each week, such as dressing yourself, bathing, or walking up the stairs. It may take a while to rebuild your strength.  Keep all follow-up visits as told by your health care provider. This is important.  Know what symptoms you should get help right away for. This information is not intended to replace advice given to you by your health care provider. Make sure you discuss any questions you  have with your health care provider. Document Released: 09/17/2017 Document Revised: 09/17/2017 Document Reviewed: 09/17/2017 Elsevier Patient Education  2020 [employer].

## 2018-12-20 NOTE — Progress Notes (Signed)
Report given to Elspeth Cho RN at [facility]. EMS transport at the bedside. Patient loaded onto stretcher and taken off unit at this time.

## 2018-12-20 NOTE — Discharge Summary (Signed)
Physician Discharge Summary  Ayrianna Mcginniss QTM:[id number] DOB: February 16, 1986 DOA: 12/14/2018  PCP: Sherrlyn Hock, MD  Admit date: 12/14/2018 Discharge date: 12/20/2018  Admitted From: SNF Disposition: SNF  Recommendations for Outpatient Follow-up:  1. Follow up with PCP in 1-2 weeks 2. Please obtain BMP/CBC in one week 3. Please follow up on the following pending results:  Home Health: None Equipment/Devices: None  Discharge Condition: Stable at baseline CODE STATUS: Full code Diet recommendation: N.p.o./nutrition through PEG tube  Subjective: Seen and examined.  Remains nonverbal at her baseline.  Grimaces.  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 She was brought to [facility] in [place] where there was no further evidence of bleeding, however pt had evidence of UTI, PNA, lactic acidosis of 6.1, leukocytosis of 17.5, hemoglobin of 6.7. She was given 3 L of IV fluid, Levaquin and 2 unit PRBC transfusion and became bradycardic and coded requiring atropine, CPR and cardioversion. Per notes she had three minutes of chest compression, epix1 and defibrillated and then pt converted back to sinus tachycardia. CXR showed LUL and RML infiltrates, but noted to have improved compared to study done 11/29/18. She was started on Levophed and transferred to [facility] cone.  Her vasopressors were stopped on 12/16/2018.  Reportedly, only one of the 2 blood culture bottles at outside hospital were growing gram-positive Staphylococcus and there was considered to be contaminant.  Reportedly her urine culture grew ESBL E. coli at  outside hospital.  Her antibiotics were eventually switched to meropenem on 12/16/2018.  Patient's tracheal aspirate grew multiple organisms which were Proteus, Pseudomonas and providentia Stuartti and those were considered to be contaminant by PCCM.  Her weight management was done by [facility] and she was initially accepted by them as well and subsequently she was transferred under [facility] care on 12/18/2018.  She was found to have large sacral and back to fibrous ulcer which were present on admission.  Wound care was consulted for that.  She also had prolonged QTC on EKG which subsequently resolved.  Her thrombocytopenia was also resolved as sepsis had resolved.  She then had some abdominal tenderness on examination on 12/18/2018 and abdominal x-ray showed possibility of ileus but no small bowel obstruction.  Her abdominal tenderness improved yesterday and has completely resolved today.  Abdominal x-ray repeated yesterday shows persistent gaseous distention with possible ileus however since she is tolerating feedings and does not have any more abdominal tenderness, this might be her baseline colonic distention due to prolonged immobilization.  Patient is stable and has been cleared by PCCM to be discharged.  Arrangements have been made for her to go back to her nursing home.  She was tested negative for COVID-19 once again yesterday.  She will continue 6 more days of Merrem to complete 10-day course.  Discharge Diagnoses:  Active Problems:   Sepsis ([place])   Anoxic brain injury ([place])   Ventilator dependence ([place])   Cardiac arrest ([place])   Prolonged Q-T interval on ECG   Hemorrhagic shock (HCC)   Septic shock (HCC)   Pneumonia of left lung due to infectious organism   Sacral decubitus ulcer, stage IV (  [place])   Chronic respiratory failure [facility])    Discharge Instructions  Discharge Instructions    Discharge patient   Complete by: As directed    Discharge disposition: 03-Skilled [place]   Discharge  patient date: 12/20/2018   Home infusion instructions [facility] May follow [place] Dosing Protocol; May administer Cathflo as needed to maintain patency of vascular access device.; Flushing of vascular access device: per [facility] Protocol: 0.9% NaCl pre/post medica...   Complete by: As directed    Instructions: May follow [place] Dosing Protocol   Instructions: May administer Cathflo as needed to maintain patency of vascular access device.   Instructions: Flushing of vascular access device: per [facility] Protocol: 0.9% NaCl pre/post medication administration and prn patency; Heparin 100 u/ml, 48m for implanted ports and Heparin 10u/ml, 514mfor all other central venous catheters.   Instructions: May follow [facility] Anaphylaxis Protocol for First Dose Administration in the home: 0.9% NaCl at 25-50 ml/hr to maintain IV access for protocol meds. Epinephrine 0.3 ml IV/IM PRN and Benadryl 25-50 IV/IM PRN s/s of anaphylaxis.   Instructions: AdRoxboronfusion Coordinator (RN) to assist per patient IV care needs in the home PRN.     Allergies as of 12/20/2018      Reactions   Acetaminophen Other (See Comments)   Per MAR   Codeine Other (See Comments)   Per MAR   Fluzone [flu Virus Vaccine] Other (See Comments)   Per MAR   Gabapentin Other (See Comments)   Per MAR   Iron Sucrose Other (See Comments)   Anaphylactoid   Pregabalin Other (See Comments)   Per MAR   Warfarin Other (See Comments)   Per MAR      Medication List    TAKE these medications   aspirin 81 MG chewable tablet Place 81 mg into feeding tube daily.   Calcium Acetate 667 MG Tabs Place 667 mg into feeding tube 3 (three) times daily.   collagenase ointment Commonly known as: SANTYL Apply 1 application topically daily. Upper back   feeding supplement (OSMOLITE 1.5 CAL) Liqd Place 50 mL/hr into feeding tube continuous.   nutrition supplement (JUVEN) Pack Place 1 packet into feeding tube 2 (two) times daily  between meals.   feeding supplement (PRO-STAT SUGAR FREE 64) Liqd Place 30 mLs into feeding tube 3 (three) times daily.   ferrous sulfate 325 (65 FE) MG tablet 325 mg every Monday, Wednesday, and Friday. Per tube   glycopyrrolate 1 MG tablet Commonly known as: ROBINUL Place 1 mg into feeding tube 4 (four) times daily.   levETIRAcetam 100 MG/ML solution Commonly known as: KEPPRA Place 1,000 mg into feeding tube 2 (two) times daily.   magnesium oxide 400 MG tablet Commonly known as: MAG-OX Place 400 mg into feeding tube daily.   meropenem  IVPB Commonly known as: MERREM Inject 1 g into the vein every 8 (eight) hours for 6 days. Indication: PNA Last Day of Therapy:  12/26/2018 Labs - Once weekly:  CBC/D and BMP, Labs - Every other week:  ESR and CRP   metoprolol tartrate 25 MG tablet Commonly known as: LOPRESSOR Place 25 mg into feeding tube 3 (three) times daily.   midodrine 5 MG tablet Commonly known as: PROAMATINE Place 5 mg into feeding tube 3 (three) times daily.   multivitamin with minerals Tabs tablet Place 1 tablet into feeding tube daily.   neomycin-bacitracin-polymyxin [phone] ointment Apply 1 application topically daily. Left medial upper back   oxyCODONE  5 MG immediate release tablet Commonly known as: Oxy IR/ROXICODONE Place 1 tablet (5 mg total) into feeding tube every 6 (six) hours for 10 doses.   PHENobarbital 64.8 MG tablet Commonly known as: LUMINAL Take 64.8 mg by mouth 2 (two) times daily.   Potassium Bicarb-Citric Acid 20 MEQ Tbef Place 20 mEq into feeding tube 2 (two) times daily.   vitamin C 500 MG tablet Commonly known as: ASCORBIC ACID Place 500 mg into feeding tube daily.   zinc sulfate 220 (50 Zn) MG capsule Place 220 mg into feeding tube daily.            Home Infusion Instuctions  (From admission, onward)         Start     Ordered   12/20/18 0000  Home infusion instructions [facility] May follow [place]  Dosing Protocol; May administer Cathflo as needed to maintain patency of vascular access device.; Flushing of vascular access device: per [facility] Protocol: 0.9% NaCl pre/post medica...    Question Answer Comment  Instructions May follow [place] Dosing Protocol   Instructions May administer Cathflo as needed to maintain patency of vascular access device.   Instructions Flushing of vascular access device: per [facility] Protocol: 0.9% NaCl pre/post medication administration and prn patency; Heparin 100 u/ml, 81m for implanted ports and Heparin 10u/ml, 572mfor all other central venous catheters.   Instructions May follow [facility] Anaphylaxis Protocol for First Dose Administration in the home: 0.9% NaCl at 25-50 ml/hr to maintain IV access for protocol meds. Epinephrine 0.3 ml IV/IM PRN and Benadryl 25-50 IV/IM PRN s/s of anaphylaxis.   Instructions [facility] Infusion Coordinator (RN) to assist per patient IV care needs in the home PRN.      12/20/18 0850         Follow-up Information    GoSherrlyn HockMD Follow up in 1 week(s).   Specialty: Internal Medicine Contact information: [phone]        Allergies  Allergen Reactions  . Acetaminophen Other (See Comments)    Per MAR  . Codeine Other (See Comments)    Per MAR  . Fluzone [Flu Virus Vaccine] Other (See Comments)    Per MAR  . Gabapentin Other (See Comments)    Per MAR  . Iron Sucrose Other (See Comments)    Anaphylactoid  . Pregabalin Other (See Comments)    Per MAR  . Warfarin Other (See Comments)    Per MAR    Consultations: PCCM   Procedures/Studies: Ct Abdomen Pelvis Wo Contrast  Result Date: 12/14/2018 CLINICAL DATA:  [age]ear old female with history of anemia of chronic disease, lactic acidosis and prior history of retroperitoneal bleed. EXAM: CT ABDOMEN AND PELVIS WITHOUT CONTRAST TECHNIQUE: Multidetector CT imaging of the abdomen and pelvis was performed  following the standard protocol without IV contrast. COMPARISON:  No priors. FINDINGS: Lower chest: Areas of atelectasis and consolidation in the dependent portions of the lower lobes of the lungs bilaterally (left greater than right). Trace left pleural effusion lying dependently. Hepatobiliary: Diffuse low attenuation throughout the hepatic parenchyma, indicative of hepatic steatosis. No discrete cystic or solid hepatic lesions are confidently identified on today's noncontrast CT examination. Unenhanced appearance of the gallbladder is unremarkable. Pancreas: No definite pancreatic mass or peripancreatic fluid collections or inflammatory changes noted on today's noncontrast CT examination. Spleen: Spleen is enlarged measuring 15.1 x 6.2 x 12.7 cm (estimated splenic volume of 595  mL) . Adrenals/Urinary Tract: 2 mm nonobstructive calculus in the upper pole collecting system of the left kidney. No additional calculi are noted in the collecting system of the right kidney, along the course of either ureter, or within the lumen of the urinary bladder. No hydroureteronephrosis. Foley balloon catheter with non the lumen of the urinary bladder. Small amount of gas non dependently within the urinary bladder is iatrogenic. Unenhanced appearance of the urinary bladder is otherwise unremarkable in appearance. Bilateral adrenal glands are normal in appearance. Stomach/Bowel: Percutaneous gastrostomy tube in place with retention balloon in the distal body or proximal antral region of the stomach. No pathologic dilatation of small bowel or colon. High attenuation material within the colon, presumably ingested enteric contents (intra colonic hemorrhage is less likely, but not entirely excluded). Postoperative changes of partial small bowel resection are noted. The appendix is not confidently identified and may be surgically absent. Regardless, there are no inflammatory changes noted adjacent to the cecum to suggest the presence of  an acute appendicitis at this time. Vascular/Lymphatic: Atherosclerotic calcifications in the pelvic vasculature. Multiple prominent borderline enlarged lymph nodes are noted throughout the small bowel mesentery, nonspecific, and potentially chronic. Borderline enlarged to mildly enlarged retrocrural lymph nodes measuring up to 1.3 cm in short axis (axial image 22 of series 3). Reproductive: Unenhanced appearance of the uterus and ovaries is unremarkable. Other: In the left retroperitoneum there is a large collection that is heterogeneous in attenuation, but generally centrally intermediate to low attenuation, likely to represent a resolving retroperitoneal hemorrhage measuring 6.5 x 4.8 x 13.1 cm (axial image 53 of series 3 and sagittal image 136 of series 7). No significant volume of ascites. No pneumoperitoneum. Musculoskeletal: Several deep decubitus ulcers are noted in the lower left thoracic region (axial image 8 of series 3, axial image 22 of series 3, and axial image 32 of series 3) and in the sacral region extending into the medial buttocks bilaterally. The sacral decubitus ulcer is deep and extends to the underlying bone, and there is sclerosis and bone loss in the underlying sacrum and coccyx, indicative of chronic osteomyelitis. IMPRESSION: 1. Large left-sided retroperitoneal fluid collection, presumably reflective of a resolving retroperitoneal hemorrhage. 2. Bibasilar areas of atelectasis and consolidation in the dependent portions of the lower lobes of the lungs bilaterally (left greater than right), with trace left pleural effusion lying dependently. 3. Multiple borderline enlarged and minimally enlarged mesenteric, retroperitoneal and retrocrural lymph nodes, as above, nonspecific, but favored to be reactive. 4. Multiple deep decubitus ulcers, most significant in the sacral region where there is extension to the underlying bone, and osseous changes of chronic osteomyelitis. 5. Hepatic steatosis. 6.  2 mm nonobstructive calculus in the upper pole collecting system of left kidney. No ureteral stones or findings of urinary tract obstruction are noted at this time. 7. Additional incidental findings, as above. Electronically Signed   By: Vinnie Langton M.D.   On: 12/14/2018 10:30   Dg Abd 1 View  Result Date: 12/19/2018 CLINICAL DATA:  Ileus. EXAM: ABDOMEN - 1 VIEW COMPARISON:  Abdominal radiograph 12/17/2018 FINDINGS: Redemonstrated diffuse gaseous distention of large and small bowel loops, likely representing ileus. No supine evidence for free air. No unexpected radiopaque foreign body. No acute finding in the visualized skeleton. Gastrostomy catheter in place. IMPRESSION: Persistent diffuse gaseous distention of large and small bowel, suspect ileus. Electronically Signed   By: Audie Pinto M.D.   On: 12/19/2018 09:46   Dg Abd 1 View  Result Date:  12/17/2018 CLINICAL DATA:  Abdominal tenderness EXAM: ABDOMEN - 1 VIEW COMPARISON:  CT abdomen and pelvis December 14, 2018 FINDINGS: Gastrostomy catheter is positioned in the stomach. There are several loops of borderline dilated small bowel. No air-fluid levels. No free air. There is atelectatic change in the left lung base. Lung bases otherwise are clear. IMPRESSION: Loops of borderline dilated small bowel. Suspect a degree of ileus or enteritis. Bowel obstruction not felt to be likely. No free air. Gastrostomy catheter positioned in region of stomach. Left lower lobe atelectatic change. Electronically Signed   By: Lowella Grip III M.D.   On: 12/17/2018 13:23   Ct Head Wo Contrast  Result Date: 12/14/2018 CLINICAL DATA:  33 year old female with history of encephalopathy. EXAM: CT HEAD WITHOUT CONTRAST TECHNIQUE: Contiguous axial images were obtained from the base of the skull through the vertex without intravenous contrast. COMPARISON:  None. FINDINGS: Brain: Severe cerebral and moderate cerebellar atrophy with ex vacuo dilatation of the  ventricular system. Patchy and confluent areas of decreased attenuation are noted throughout the deep and periventricular white matter of the cerebral hemispheres bilaterally, compatible with chronic microvascular ischemic disease. No evidence of acute infarction, hemorrhage, hydrocephalus, extra-axial collection or mass lesion/mass effect. Vascular: No hyperdense vessel or unexpected calcification. Skull: Normal. Negative for fracture or focal lesion. Sinuses/Orbits: No acute finding. Other: None. IMPRESSION: 1. No acute intracranial abnormalities. 2. Severe age advanced cerebral and cerebellar atrophy with extensive chronic microvascular ischemic changes throughout the cerebral white matter, as above. Electronically Signed   By: Vinnie Langton M.D.   On: 12/14/2018 10:11   Ir Replace G-tube Simple Wo Fluoro  Result Date: 12/15/2018 INDICATION: Dislodged gastrostomy tube. Request replacement. Balloon retention tube originally placed at outside facility. EXAM: REPLACEMENT OF GASTROSTOMY TUBE COMPARISON:  None. MEDICATIONS: None. CONTRAST:  None administered into the gastric lumen FLUOROSCOPY TIME:  None COMPLICATIONS: None immediate. PROCEDURE: The upper abdomen and external portion of the existing gastrostomy tube was prepped and draped in the usual sterile fashion, and a sterile drape was applied covering the operative field. Maximum barrier sterile technique with sterile gowns and gloves were used for the procedure. A timeout was performed prior to the initiation of the procedure. The existing gastrostomy tube developed a ruptured balloon and was inadvertently dislodged. A 16 French Foley catheter was placed at the bedside by primary team. The existing Foley tube was removed and exchanged for a new 18-French MIC balloon inflatable gastrostomy tube. The balloon was inflated and disc was cinched. Tube was flushed easily with tap water and aspirated gastric contents confirming intraluminal position. The tube  was then flushed again with 30 mL tap water. A dressing was placed. The patient tolerated the procedure well without immediate postprocedural complication. IMPRESSION: Successful bedside replacement of a new 18-French gastrostomy tube. The new gastrostomy tube is ready for immediate use. Read by: Ascencion Dike PA-C Electronically Signed   By: Jerilynn Mages.  Shick M.D.   On: 12/15/2018 13:28   Dg Chest Port 1 View  Result Date: 12/14/2018 CLINICAL DATA:  Pneumonia EXAM: PORTABLE CHEST 1 VIEW COMPARISON:  None. FINDINGS: Port on the right with tip at the brachiocephalic SVC confluence. Port indication is uncertain. There is a tracheostomy tube in place. Low volume chest with asymmetric hazy opacification on the left. Normal heart size. The right lung is clear. No pneumothorax. IMPRESSION: History of pneumonia with extensive hazy opacification of the left chest. Lung volumes are low and there is atelectasis on the right. Electronically Signed  By: Monte Fantasia M.D.   On: 12/14/2018 05:51   Vas [place] Upper Extremity Venous Duplex  Result Date: 12/16/2018 UPPER VENOUS STUDY  Indications: Swelling Limitations: Trach collar, positioning. Comparison Study: No prior right Upper ext. Performing Technologist: Oda Cogan RDMS, RVT  Examination Guidelines: A complete evaluation includes B-mode imaging, spectral Doppler, color Doppler, and power Doppler as needed of all accessible portions of each vessel. Bilateral testing is considered an integral part of a complete examination. Limited examinations for reoccurring indications may be performed as noted.  Right Findings: +----------+------------+---------+-----------+----------+--------------+ RIGHT     CompressiblePhasicitySpontaneousProperties   Summary     +----------+------------+---------+-----------+----------+--------------+ IJV           Full       Yes       Yes                              +----------+------------+---------+-----------+----------+--------------+ Subclavian                                              patent     +----------+------------+---------+-----------+----------+--------------+ Axillary      Full       Yes       Yes                             +----------+------------+---------+-----------+----------+--------------+ Brachial      Full       Yes       Yes                             +----------+------------+---------+-----------+----------+--------------+ Radial                                                  patent     +----------+------------+---------+-----------+----------+--------------+ Ulnar                                                   Patent     +----------+------------+---------+-----------+----------+--------------+ Cephalic                                            Not visualized +----------+------------+---------+-----------+----------+--------------+ Basilic       Full       Yes       Yes                             +----------+------------+---------+-----------+----------+--------------+  Left Findings: +----------+------------+---------+-----------+----------+-------+ LEFT      CompressiblePhasicitySpontaneousPropertiesSummary +----------+------------+---------+-----------+----------+-------+ Subclavian               Yes       Yes              patent  +----------+------------+---------+-----------+----------+-------+  Summary:  Right: No evidence of deep vein thrombosis in the upper extremity. Right patent basilic vein. Cephalic vein was  not clearly visualized.  Left: No evidence of thrombosis in the subclavian.  *See table(s) above for measurements and observations.  Diagnosing physician: Monica Martinez MD Electronically signed by Monica Martinez MD on 12/16/2018 at 3:13:56 PM.    Final    Vas [place] Upper Extremity Venous Duplex  Result Date: 12/15/2018 UPPER VENOUS STUDY  Indications:  Edema Limitations: Edema, trach collar, positioning. Comparison Study: No prior Performing Technologist: Sharion Dove RVS  Examination Guidelines: A complete evaluation includes B-mode imaging, spectral Doppler, color Doppler, and power Doppler as needed of all accessible portions of each vessel. Bilateral testing is considered an integral part of a complete examination. Limited examinations for reoccurring indications may be performed as noted.  Left Findings: +----------+------------+---------+-----------+----------+---------------+ LEFT      CompressiblePhasicitySpontaneousProperties    Summary     +----------+------------+---------+-----------+----------+---------------+ IJV                                                 Not visualized  +----------+------------+---------+-----------+----------+---------------+ Subclavian                                          Not visualized  +----------+------------+---------+-----------+----------+---------------+ Axillary                 Yes       Yes                              +----------+------------+---------+-----------+----------+---------------+ Brachial      Full       Yes       Yes                              +----------+------------+---------+-----------+----------+---------------+ Radial                                              patent by Color +----------+------------+---------+-----------+----------+---------------+ Ulnar                                               patent by color +----------+------------+---------+-----------+----------+---------------+ Cephalic      Full                                                  +----------+------------+---------+-----------+----------+---------------+ Basilic                                             Not visualized  +----------+------------+---------+-----------+----------+---------------+  Summary:  Left: No evidence of deep vein thrombosis in  the upper extremity. However, unable to visualize the basilic, subclavian, IJ. No evidence of superficial vein thrombosis in the upper extremity. However, unable to visualize the basilic, subclavian, IJ. No evidence of thrombosis in the .  However, unable to visualize the basilic, subclavian, IJ. This was a limited study.  *See table(s) above for measurements and observations.  Diagnosing physician: Servando Snare MD Electronically signed by Servando Snare MD on 12/15/2018 at 2:17:36 PM.    Final       Discharge Exam: Vitals:   12/20/18 0600 12/20/18 0722  BP: (!) 102/51 (!) 109/48  Pulse: 99 (!) 112  Resp: 20 (!) 22  Temp:  98 F (36.7 C)  SpO2: 100% 100%   Vitals:   12/20/18 0448 12/20/18 0500 12/20/18 0600 12/20/18 0722  BP: 109/83 (!) 106/51 (!) 102/51 (!) 109/48  Pulse:  100 99 (!) 112  Resp:  20 20 (!) 21  Temp:    98 F (36.7 C)  TempSrc:    Axillary  SpO2: 100% 100% 100% 100%  Weight:      Height:        General: Pt is alert and awake but nonverbal. Cardiovascular: RRR, S1/S2 +, no rubs, no gallops Respiratory: CTA bilaterally, no wheezing, no rhonchi, chronically on trach and went. Abdominal: Soft, NT, ND, bowel sounds + Extremities: +1 pitting edema bilateral upper extremity.  No cyanosis.  No edema bilateral lower extremity.  Contractures bilateral lower extremities.    The results of significant diagnostics from this hospitalization (including imaging, microbiology, ancillary and laboratory) are listed below for reference.     Microbiology: Recent Results (from the past 240 hour(s))  MRSA PCR Screening     Status: None   Collection Time: 12/14/18  6:09 AM   Specimen: Nasal Mucosa; Nasopharyngeal  Result Value Ref Range Status   MRSA by PCR NEGATIVE NEGATIVE Final    Comment:        The GeneXpert MRSA Assay (FDA approved for NASAL specimens only), is one component of a comprehensive MRSA colonization surveillance program. It is not intended to diagnose  MRSA infection nor to guide or monitor treatment for MRSA infections. Performed at [facility], [place] [address] [place], [place] [address]   Culture, blood (routine x 2)     Status: None   Collection Time: 12/14/18  8:20 AM   Specimen: BLOOD RIGHT HAND  Result Value Ref Range Status   Specimen Description BLOOD RIGHT HAND  Final   Special Requests   Final    BOTTLES DRAWN AEROBIC ONLY Blood Culture results may not be optimal due to an inadequate volume of blood received in culture bottles   Culture   Final    NO GROWTH 5 DAYS Performed at [facility], [place] [address] [place], [place] [address]    Report Status 12/19/2018 FINAL  Final  Culture, blood (routine x 2)     Status: None   Collection Time: 12/14/18  8:34 AM   Specimen: BLOOD LEFT HAND  Result Value Ref Range Status   Specimen Description BLOOD LEFT HAND  Final   Special Requests   Final    BOTTLES DRAWN AEROBIC ONLY Blood Culture results may not be optimal due to an inadequate volume of blood received in culture bottles   Culture   Final    NO GROWTH 5 DAYS Performed at [facility], [place] [address] [place], [place] [address]    Report Status 12/19/2018 FINAL  Final  Culture, respiratory     Status: None   Collection Time: 12/14/18  8:55 AM   Specimen: Tracheal Aspirate; Respiratory  Result Value Ref Range Status   Specimen Description TRACHEAL ASPIRATE  Final  Special Requests NONE  Final   Gram Stain   Final    NO WBC SEEN RARE GRAM NEGATIVE RODS Performed at [facility], [place] [address], [place], [place] [address]    Culture   Final    FEW PROTEUS MIRABILIS FEW PSEUDOMONAS AERUGINOSA FEW PROVIDENCIA STUARTII    Report Status 12/16/2018 FINAL  Final   Organism ID, Bacteria PROTEUS MIRABILIS  Final   Organism ID, Bacteria PSEUDOMONAS AERUGINOSA  Final   Organism ID, Bacteria PROVIDENCIA STUARTII  Final      Susceptibility   Pseudomonas aeruginosa - MIC*    CEFTAZIDIME 4  SENSITIVE Sensitive     CIPROFLOXACIN >=4 RESISTANT Resistant     GENTAMICIN >=16 RESISTANT Resistant     IMIPENEM >=16 RESISTANT Resistant     PIP/TAZO 32 SENSITIVE Sensitive     CEFEPIME 16 INTERMEDIATE Intermediate     * FEW PSEUDOMONAS AERUGINOSA   Proteus mirabilis - MIC*    AMPICILLIN >=32 RESISTANT Resistant     CEFAZOLIN >=64 RESISTANT Resistant     CEFEPIME RESISTANT Resistant     CEFTAZIDIME RESISTANT Resistant     CEFTRIAXONE >=64 RESISTANT Resistant     CIPROFLOXACIN >=4 RESISTANT Resistant     GENTAMICIN >=16 RESISTANT Resistant     IMIPENEM 1 SENSITIVE Sensitive     TRIMETH/SULFA <=20 SENSITIVE Sensitive     AMPICILLIN/SULBACTAM >=32 RESISTANT Resistant     PIP/TAZO <=4 SENSITIVE Sensitive     * FEW PROTEUS MIRABILIS   Providencia stuartii - MIC*    AMPICILLIN >=32 RESISTANT Resistant     CEFAZOLIN >=64 RESISTANT Resistant     CEFEPIME <=1 SENSITIVE Sensitive     CEFTAZIDIME <=1 SENSITIVE Sensitive     CEFTRIAXONE <=1 SENSITIVE Sensitive     CIPROFLOXACIN >=4 RESISTANT Resistant     GENTAMICIN RESISTANT Resistant     IMIPENEM 2 SENSITIVE Sensitive     TRIMETH/SULFA <=20 SENSITIVE Sensitive     AMPICILLIN/SULBACTAM >=32 RESISTANT Resistant     PIP/TAZO <=4 SENSITIVE Sensitive     * FEW PROVIDENCIA STUARTII  SARS CORONAVIRUS 2 (TAT 6-24 HRS) Nasopharyngeal Nasopharyngeal Swab     Status: None   Collection Time: 12/19/18  4:20 PM   Specimen: Nasopharyngeal Swab  Result Value Ref Range Status   SARS Coronavirus 2 NEGATIVE NEGATIVE Final    Comment: (NOTE) SARS-CoV-2 target nucleic acids are NOT DETECTED. The SARS-CoV-2 RNA is generally detectable in upper and lower respiratory specimens during the acute phase of infection. Negative results do not preclude SARS-CoV-2 infection, do not rule out co-infections with other pathogens, and should not be used as the sole basis for treatment or other patient management decisions. Negative results must be combined with  clinical observations, patient history, and epidemiological information. The expected result is Negative. Fact Sheet for Patients: SugarRoll.be Fact Sheet for Healthcare Providers: https://www.woods-mathews.com/ This test is not yet approved or cleared by the [place] FDA and  has been authorized for detection and/or diagnosis of SARS-CoV-2 by FDA under an Emergency Use Authorization (EUA). This EUA will remain  in effect (meaning this test can be used) for the duration of the COVID-19 declaration under Section 56 4(b)(1) of the Act, 21 U.S.C. section 360bbb-3(b)(1), unless the authorization is terminated or revoked sooner. Performed at [facility], [place] [address], [place], [place] [address]      Labs: BNP (last 3 results) No results for input(s): BNP in the last 8760 hours. Basic Metabolic Panel: Recent Labs  Lab 12/14/18 1111 12/15/18 0508  12/15/18 1647 12/15/18 2049 12/16/18 0509 12/16/18 1653 12/17/18 0411 12/18/18 0050 12/18/18 1013 12/19/18 0323 12/19/18 2333 12/20/18 0335  NA 140 144   < >  --   --  141  --  142 138 138 137  --  140  K 4.7 <2.0*   < >  --   --  2.5*  --  3.9 3.0* 4.1 3.6  --  3.7  CL 106 115*   < >  --   --  115*  --  116* 107 106 106  --  108  CO2 24 22   < >  --   --  20*  --  20* _0 --  24  GLUCOSE 91 93   < >  --   --  96  --  90 96 88 92  --  91  BUN 28* 17   < >  --   --  17  --  _1 --  12  CREATININE 0.42* <0.30*   < >  --   --  <0.30*  --  <0.30* <0.30* <0.30* <0.30*  --  <0.30*  CALCIUM 8.1* 8.3*   < >  --   --  8.5*  --  8.5* 7.7* 7.5* 7.5*  --  8.2*  MG 2.1 1.8  --   --  2.0  --   --   --  1.3*  --   --  1.5*  --   PHOS  --   --   --  2.0*  --  1.9* 1.7*  --  <1.0* 3.2  --   --   --    < > = values in this interval not displayed.   Liver Function Tests: Recent Labs  Lab 12/18/18 0050  AST 14*  ALT 22  ALKPHOS 248*  BILITOT 0.2*  PROT 5.3*  ALBUMIN 1.3*    No results for input(s): LIPASE, AMYLASE in the last 168 hours. No results for input(s): AMMONIA in the last 168 hours. CBC: Recent Labs  Lab 12/17/18 0411 12/17/18 1548 12/18/18 0050 12/19/18 0323 12/20/18 0335  WBC 3.3* 5.7 4.9 6.7 6.8  HGB 9.5* 8.7* 7.9* 9.2* 9.6*  HCT 30.7* 27.3* 25.5* 29.6* 31.7*  MCV 89.2 88.9 89.5 90.5 91.9  PLT 93* 371 299 358 394   Cardiac Enzymes: No results for input(s): CKTOTAL, CKMB, CKMBINDEX, TROPONINI in the last 168 hours. BNP: Invalid input(s): POCBNP CBG: Recent Labs  Lab 12/19/18 1522 12/19/18 1934 12/19/18 2315 12/20/18 0313 12/20/18 0724  GLUCAP 78 94 95 83 85   D-Dimer No results for input(s): DDIMER in the last 72 hours. Hgb A1c No results for input(s): HGBA1C in the last 72 hours. Lipid Profile No results for input(s): CHOL, HDL, LDLCALC, TRIG, CHOLHDL, LDLDIRECT in the last 72 hours. Thyroid function studies No results for input(s): TSH, T4TOTAL, T3FREE, THYROIDAB in the last 72 hours.  Invalid input(s): FREET3 Anemia work up No results for input(s): VITAMINB12, FOLATE, FERRITIN, TIBC, IRON, RETICCTPCT in the last 72 hours. Urinalysis    Component Value Date/Time   COLORURINE AMBER (A) 12/14/2018 0532   APPEARANCEUR HAZY (A) 12/14/2018 0532   LABSPEC 1.014 12/14/2018 0532   PHURINE 5.0 12/14/2018 0532   GLUCOSEU NEGATIVE 12/14/2018 0532   HGBUR NEGATIVE 12/14/2018 0532   BILIRUBINUR NEGATIVE 12/14/2018 0532   KETONESUR NEGATIVE 12/14/2018 0532   PROTEINUR NEGATIVE 12/14/2018 0532   NITRITE NEGATIVE  12/14/2018 0532   LEUKOCYTESUR SMALL (A) 12/14/2018 0532   Sepsis Labs Invalid input(s): PROCALCITONIN,  WBC,  LACTICIDVEN Microbiology Recent Results (from the past 240 hour(s))  MRSA PCR Screening     Status: None   Collection Time: 12/14/18  6:09 AM   Specimen: Nasal Mucosa; Nasopharyngeal  Result Value Ref Range Status   MRSA by PCR NEGATIVE NEGATIVE Final    Comment:        The GeneXpert MRSA Assay  (FDA approved for NASAL specimens only), is one component of a comprehensive MRSA colonization surveillance program. It is not intended to diagnose MRSA infection nor to guide or monitor treatment for MRSA infections. Performed at [facility], [place] [address], [place], [place] [address]   Culture, blood (routine x 2)     Status: None   Collection Time: 12/14/18  8:20 AM   Specimen: BLOOD RIGHT HAND  Result Value Ref Range Status   Specimen Description BLOOD RIGHT HAND  Final   Special Requests   Final    BOTTLES DRAWN AEROBIC ONLY Blood Culture results may not be optimal due to an inadequate volume of blood received in culture bottles   Culture   Final    NO GROWTH 5 DAYS Performed at [facility], [place] [address] [place], [place] [address]    Report Status 12/19/2018 FINAL  Final  Culture, blood (routine x 2)     Status: None   Collection Time: 12/14/18  8:34 AM   Specimen: BLOOD LEFT HAND  Result Value Ref Range Status   Specimen Description BLOOD LEFT HAND  Final   Special Requests   Final    BOTTLES DRAWN AEROBIC ONLY Blood Culture results may not be optimal due to an inadequate volume of blood received in culture bottles   Culture   Final    NO GROWTH 5 DAYS Performed at [facility], [place] [address], [place], [place] [address]    Report Status 12/19/2018 FINAL  Final  Culture, respiratory     Status: None   Collection Time: 12/14/18  8:55 AM   Specimen: Tracheal Aspirate; Respiratory  Result Value Ref Range Status   Specimen Description TRACHEAL ASPIRATE  Final   Special Requests NONE  Final   Gram Stain   Final    NO WBC SEEN RARE GRAM NEGATIVE RODS Performed at [facility], [place] [address] [place], [place] [address]    Culture   Final    FEW PROTEUS MIRABILIS FEW PSEUDOMONAS AERUGINOSA FEW PROVIDENCIA STUARTII    Report Status 12/16/2018 FINAL  Final   Organism ID, Bacteria PROTEUS MIRABILIS  Final   Organism ID, Bacteria  PSEUDOMONAS AERUGINOSA  Final   Organism ID, Bacteria PROVIDENCIA STUARTII  Final      Susceptibility   Pseudomonas aeruginosa - MIC*    CEFTAZIDIME 4 SENSITIVE Sensitive     CIPROFLOXACIN >=4 RESISTANT Resistant     GENTAMICIN >=16 RESISTANT Resistant     IMIPENEM >=16 RESISTANT Resistant     PIP/TAZO 32 SENSITIVE Sensitive     CEFEPIME 16 INTERMEDIATE Intermediate     * FEW PSEUDOMONAS AERUGINOSA   Proteus mirabilis - MIC*    AMPICILLIN >=32 RESISTANT Resistant     CEFAZOLIN >=64 RESISTANT Resistant     CEFEPIME RESISTANT Resistant     CEFTAZIDIME RESISTANT Resistant     CEFTRIAXONE >=64 RESISTANT Resistant     CIPROFLOXACIN >=4 RESISTANT Resistant     GENTAMICIN >=16 RESISTANT  Resistant     IMIPENEM 1 SENSITIVE Sensitive     TRIMETH/SULFA <=20 SENSITIVE Sensitive     AMPICILLIN/SULBACTAM >=32 RESISTANT Resistant     PIP/TAZO <=4 SENSITIVE Sensitive     * FEW PROTEUS MIRABILIS   Providencia stuartii - MIC*    AMPICILLIN >=32 RESISTANT Resistant     CEFAZOLIN >=64 RESISTANT Resistant     CEFEPIME <=1 SENSITIVE Sensitive     CEFTAZIDIME <=1 SENSITIVE Sensitive     CEFTRIAXONE <=1 SENSITIVE Sensitive     CIPROFLOXACIN >=4 RESISTANT Resistant     GENTAMICIN RESISTANT Resistant     IMIPENEM 2 SENSITIVE Sensitive     TRIMETH/SULFA <=20 SENSITIVE Sensitive     AMPICILLIN/SULBACTAM >=32 RESISTANT Resistant     PIP/TAZO <=4 SENSITIVE Sensitive     * FEW PROVIDENCIA STUARTII  SARS CORONAVIRUS 2 (TAT 6-24 HRS) Nasopharyngeal Nasopharyngeal Swab     Status: None   Collection Time: 12/19/18  4:20 PM   Specimen: Nasopharyngeal Swab  Result Value Ref Range Status   SARS Coronavirus 2 NEGATIVE NEGATIVE Final    Comment: (NOTE) SARS-CoV-2 target nucleic acids are NOT DETECTED. The SARS-CoV-2 RNA is generally detectable in upper and lower respiratory specimens during the acute phase of infection. Negative results do not preclude SARS-CoV-2 infection, do not rule out co-infections with  other pathogens, and should not be used as the sole basis for treatment or other patient management decisions. Negative results must be combined with clinical observations, patient history, and epidemiological information. The expected result is Negative. Fact Sheet for Patients: SugarRoll.be Fact Sheet for Healthcare Providers: https://www.woods-mathews.com/ This test is not yet approved or cleared by the [place] FDA and  has been authorized for detection and/or diagnosis of SARS-CoV-2 by FDA under an Emergency Use Authorization (EUA). This EUA will remain  in effect (meaning this test can be used) for the duration of the COVID-19 declaration under Section 56 4(b)(1) of the Act, 21 U.S.C. section 360bbb-3(b)(1), unless the authorization is terminated or revoked sooner. Performed at [facility], [place] [address], [place], [place] [address]      Time coordinating discharge: Over 30 minutes  SIGNED:   Darliss Cheney, MD  [facility] 12/20/2018, 8:51 AM  If 7PM-7AM, please contact night-coverage [email or web address] Password [email or web address]

## 2019-02-24 DEATH — deceased

## 2021-02-11 IMAGING — CT CT ABD-PELV W/O CM
2 of 4 series · 14 of 46 positions shown, 16 images · non-contrast
Comparison: No priors.

CLINICAL DATA: 33-year-old female with history of anemia of chronic
disease, lactic acidosis and prior history of retroperitoneal bleed.

EXAM:
CT ABDOMEN AND PELVIS WITHOUT CONTRAST
TECHNIQUE: Multidetector CT imaging of the abdomen and pelvis was performed
following the standard protocol without IV contrast.

[Series 3: a/p w/o 5mm · axial · non-contrast · 0.73mm/px · z∈[-456,-1]mm · 11 of 109 slices shown, 13 images]
[im 9/109  soft-tissue]
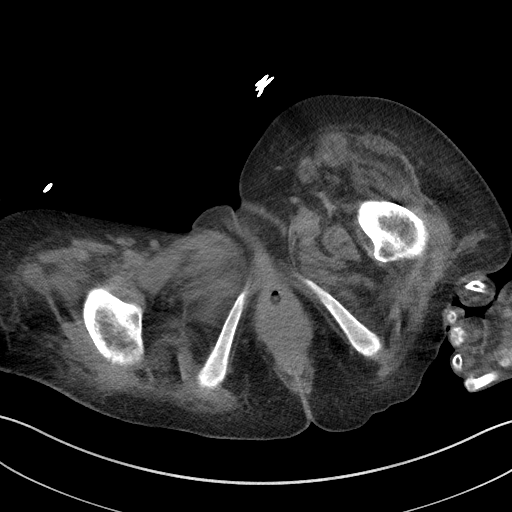
[im 9/109  bone]
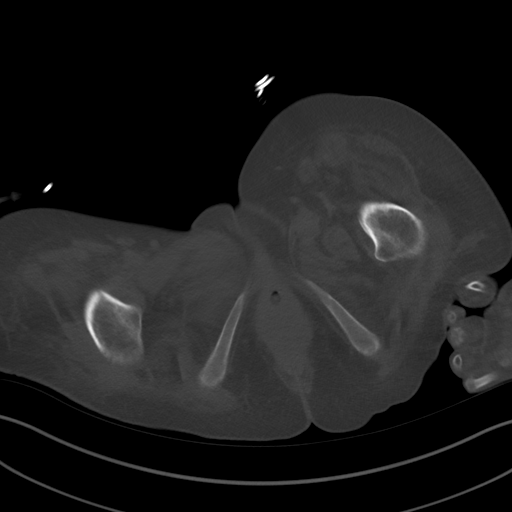
[im 18/109  soft-tissue]
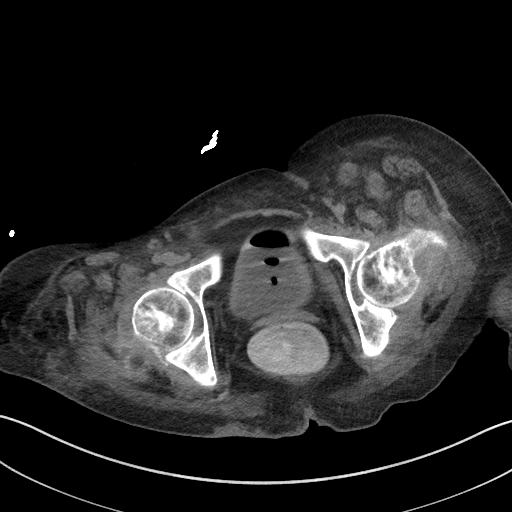
[im 26/109  soft-tissue]
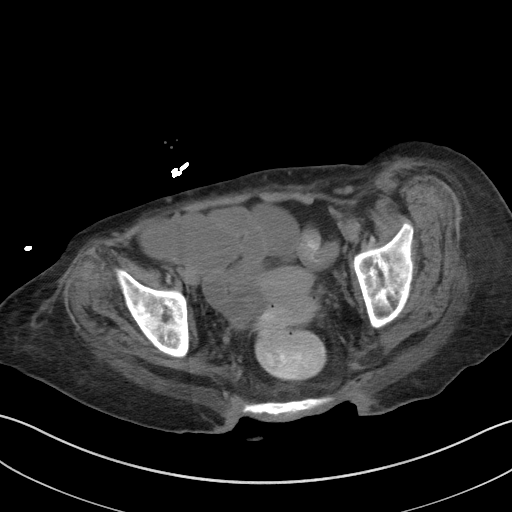
[im 35/109  soft-tissue]
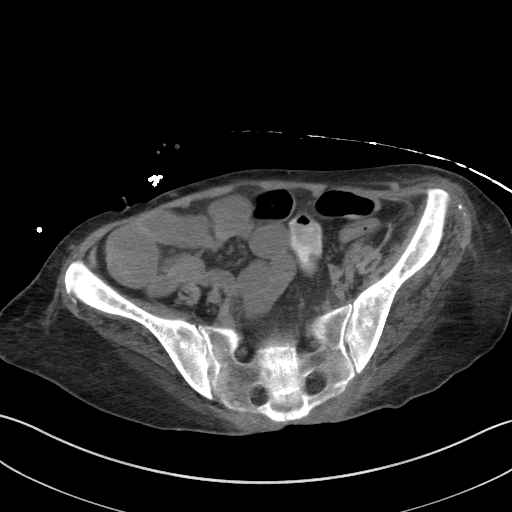
[im 44/109  soft-tissue]
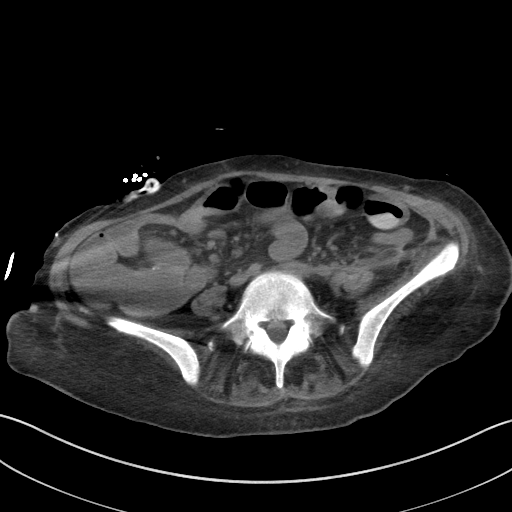
[im 57/109  soft-tissue]
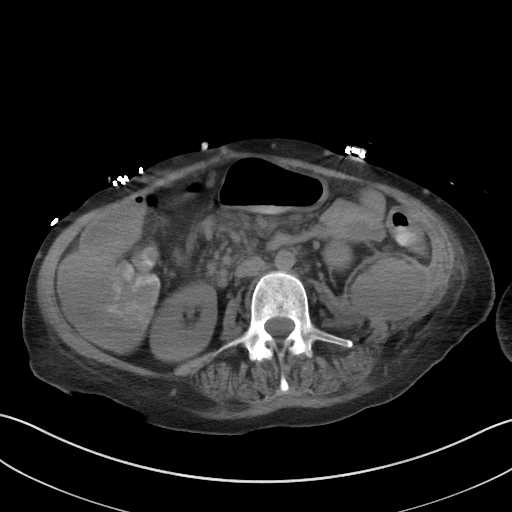
[im 65/109  soft-tissue]
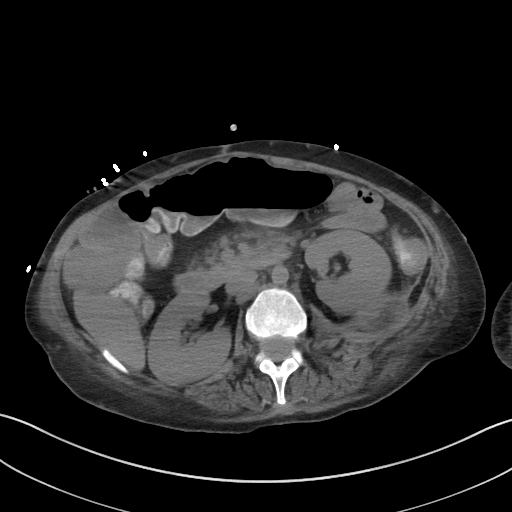
[im 74/109  soft-tissue]
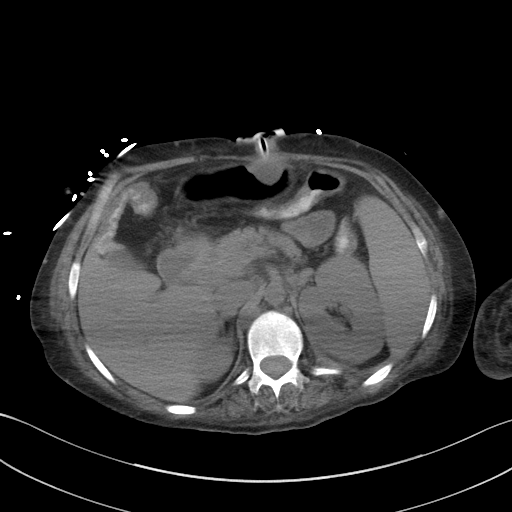
[im 83/109  soft-tissue]
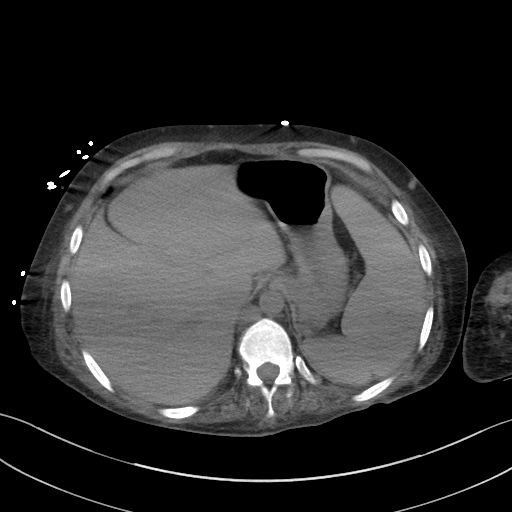
[im 83/109  bone]
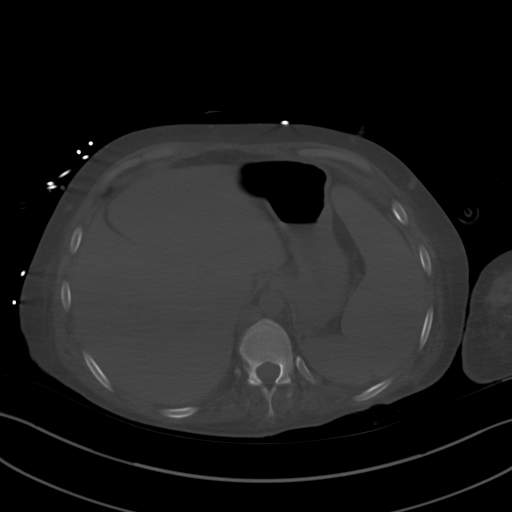
[im 91/109  soft-tissue]
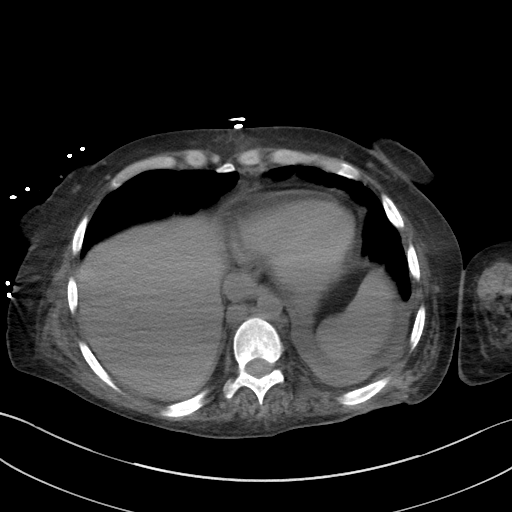
[im 100/109  soft-tissue]
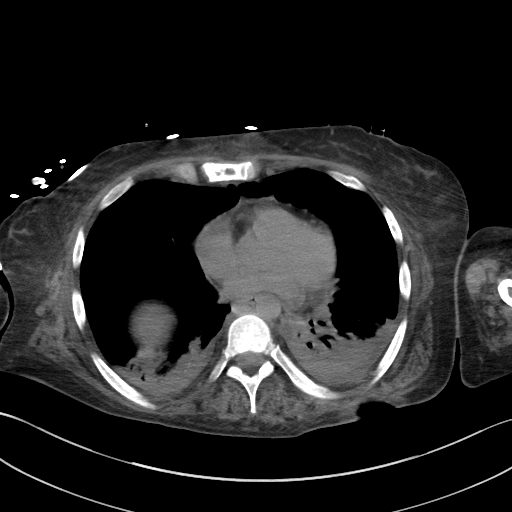

[Series 6: a/p w/o cor · coronal · non-contrast · 0.79mm/px · 3 of 119 slices shown]
[im 40/119  soft-tissue]
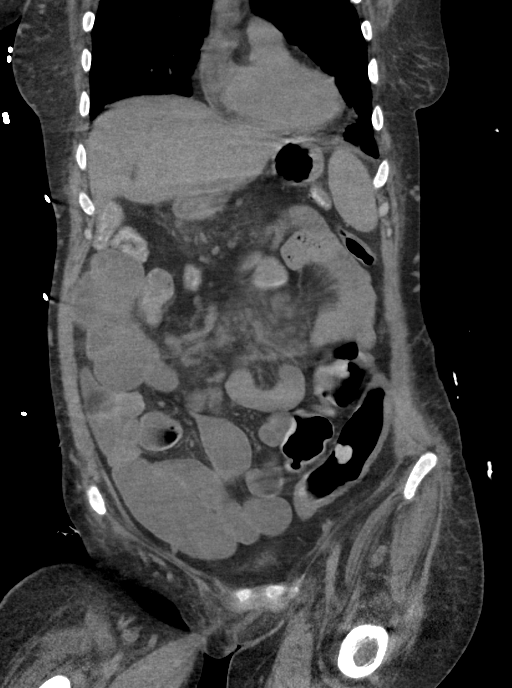
[im 53/119  soft-tissue]
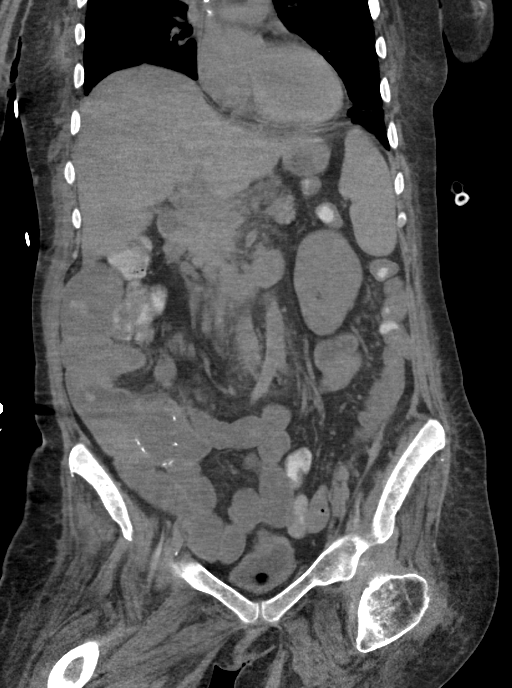
[im 66/119  soft-tissue]
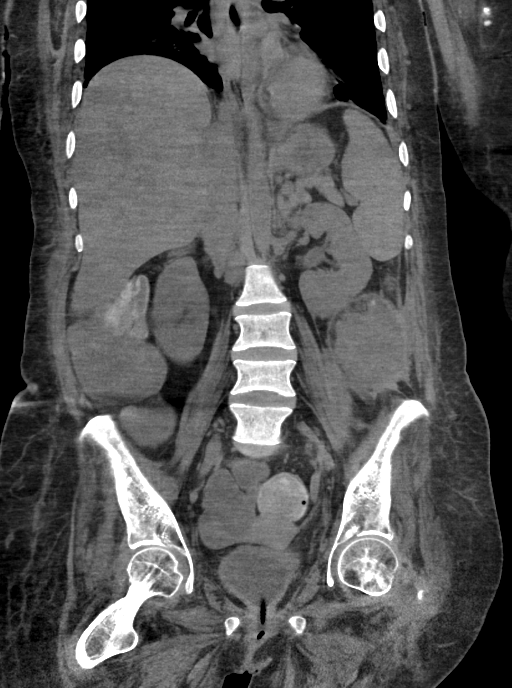

[14 of 46 positions shown; findings below may reference images not displayed]

FINDINGS: Lower chest: Areas of atelectasis and consolidation in the dependent
portions of the lower lobes of the lungs bilaterally (left greater
than right). Trace left pleural effusion lying dependently.

Hepatobiliary: Diffuse low attenuation throughout the hepatic
parenchyma, indicative of hepatic steatosis. No discrete cystic or
solid hepatic lesions are confidently identified on today's
noncontrast CT examination. Unenhanced appearance of the gallbladder
is unremarkable.

Pancreas: No definite pancreatic mass or peripancreatic fluid
collections or inflammatory changes noted on today's noncontrast CT
examination.

Spleen: Spleen is enlarged measuring 15.1 x 6.2 x 12.7 cm (estimated
splenic volume of 595 mL) .

Adrenals/Urinary Tract: 2 mm nonobstructive calculus in the upper
pole collecting system of the left kidney. No additional calculi are
noted in the collecting system of the right kidney, along the course
of either ureter, or within the lumen of the urinary bladder. No
hydroureteronephrosis. Foley balloon catheter with non the lumen of
the urinary bladder. Small amount of gas non dependently within the
urinary bladder is iatrogenic. Unenhanced appearance of the urinary
bladder is otherwise unremarkable in appearance. Bilateral adrenal
glands are normal in appearance.

Stomach/Bowel: Percutaneous gastrostomy tube in place with retention
balloon in the distal body or proximal antral region of the stomach.
No pathologic dilatation of small bowel or colon. High attenuation
material within the colon, presumably ingested enteric contents
(intra colonic hemorrhage is less likely, but not entirely
excluded). Postoperative changes of partial small bowel resection
are noted. The appendix is not confidently identified and may be
surgically absent. Regardless, there are no inflammatory changes
noted adjacent to the cecum to suggest the presence of an acute
appendicitis at this time.

Vascular/Lymphatic: Atherosclerotic calcifications in the pelvic
vasculature. Multiple prominent borderline enlarged lymph nodes are
noted throughout the small bowel mesentery, nonspecific, and
potentially chronic. Borderline enlarged to mildly enlarged
retrocrural lymph nodes measuring up to 1.3 cm in short axis (axial
image 22 of series 3).

Reproductive: Unenhanced appearance of the uterus and ovaries is
unremarkable.

Other: In the left retroperitoneum there is a large collection that
is heterogeneous in attenuation, but generally centrally
intermediate to low attenuation, likely to represent a resolving
retroperitoneal hemorrhage measuring 6.5 x 4.8 x 13.1 cm (axial
image 53 of series 3 and sagittal image 136 of series 7). No
significant volume of ascites. No pneumoperitoneum.

Musculoskeletal: Several deep decubitus ulcers are noted in the
lower left thoracic region (axial image 8 of series 3, axial image
22 of series 3, and axial image 32 of series 3) and in the sacral
region extending into the medial buttocks bilaterally. The sacral
decubitus ulcer is deep and extends to the underlying bone, and
there is sclerosis and bone loss in the underlying sacrum and
coccyx, indicative of chronic osteomyelitis.
IMPRESSION: 1. Large left-sided retroperitoneal fluid collection, presumably
reflective of a resolving retroperitoneal hemorrhage.
2. Bibasilar areas of atelectasis and consolidation in the dependent
portions of the lower lobes of the lungs bilaterally (left greater
than right), with trace left pleural effusion lying dependently.
3. Multiple borderline enlarged and minimally enlarged mesenteric,
retroperitoneal and retrocrural lymph nodes, as above, nonspecific,
but favored to be reactive.
4. Multiple deep decubitus ulcers, most significant in the sacral
region where there is extension to the underlying bone, and osseous
changes of chronic osteomyelitis.
5. Hepatic steatosis.
6. 2 mm nonobstructive calculus in the upper pole collecting system
of left kidney. No ureteral stones or findings of urinary tract
obstruction are noted at this time.
7. Additional incidental findings, as above.
